# Patient Record
Sex: Male | Born: 1940
Health system: Southern US, Community
[De-identification: ages and names within clinical notes are randomized; demographics above are authoritative.]

## PROBLEM LIST (undated history)

## (undated) DIAGNOSIS — K4090 Unilateral inguinal hernia, without obstruction or gangrene, not specified as recurrent: Secondary | ICD-10-CM

## (undated) DIAGNOSIS — E785 Hyperlipidemia, unspecified: Secondary | ICD-10-CM

## (undated) DIAGNOSIS — M1990 Unspecified osteoarthritis, unspecified site: Secondary | ICD-10-CM

## (undated) HISTORY — DX: Unilateral inguinal hernia, without obstruction or gangrene, not specified as recurrent: K40.90

## (undated) HISTORY — DX: Hyperlipidemia, unspecified: E78.5

## (undated) HISTORY — DX: Unspecified osteoarthritis, unspecified site: M19.90

## (undated) HISTORY — PX: MEDIAL PARTIAL KNEE REPLACEMENT: SHX5965

## (undated) HISTORY — PX: VASECTOMY: SHX75

---

## 2008-06-15 ENCOUNTER — Emergency Department: Payer: Self-pay | Admitting: Emergency Medicine

## 2010-12-29 ENCOUNTER — Ambulatory Visit: Payer: Self-pay | Admitting: Unknown Physician Specialty

## 2011-01-21 ENCOUNTER — Ambulatory Visit (INDEPENDENT_AMBULATORY_CARE_PROVIDER_SITE_OTHER): Payer: Medicare Other | Admitting: Family Medicine

## 2011-01-21 ENCOUNTER — Encounter: Payer: Self-pay | Admitting: Family Medicine

## 2011-01-21 DIAGNOSIS — Z8042 Family history of malignant neoplasm of prostate: Secondary | ICD-10-CM

## 2011-01-21 DIAGNOSIS — M1990 Unspecified osteoarthritis, unspecified site: Secondary | ICD-10-CM

## 2011-01-21 DIAGNOSIS — E7800 Pure hypercholesterolemia, unspecified: Secondary | ICD-10-CM

## 2011-01-21 DIAGNOSIS — E785 Hyperlipidemia, unspecified: Secondary | ICD-10-CM | POA: Insufficient documentation

## 2011-01-21 NOTE — Assessment & Plan Note (Signed)
Flu shot encouraged.  Continue diet and exercise.  Requesting records.  Return for labs.

## 2011-01-21 NOTE — Assessment & Plan Note (Signed)
PSA false positives d/w pt.  Requesting records.  Return for labs.

## 2011-01-21 NOTE — Assessment & Plan Note (Addendum)
Per ortho.  Requesting records.

## 2011-01-21 NOTE — Patient Instructions (Signed)
I would get a flu shot each fall.   I'll check your old records for the pneumonia and shingles shot.  If you haven't had them already, then it would be reasonable to get them in the future.   Come back for fasting labs.  You can get your results through our phone system.  Follow the instructions on the blue card. Glad to see you.  Take care.   I would check your labs again next year before a physical.

## 2011-01-21 NOTE — Progress Notes (Signed)
Elevated Cholesterol: Using medications without problems:no rx meds Muscle aches: no Diet compliance:yes Exercise:yes  FH prostate cancer, no personal hx. Prev with normal PSAs.  Requesting records.  No pelvic pain, no dysuria.  No blood known in urine.   OA, knee, s/p synvisc injections. May end up with replacements. Has affected his golf game. Often puffy after exercise.   PMH and SH reviewed  Meds, vitals, and allergies reviewed.   ROS: See HPI.  Otherwise negative.    GEN: nad, alert and oriented HEENT: mucous membranes moist NECK: supple w/o LA CV: rrr. PULM: ctab, no inc wob ABD: soft, +bs EXT: no edema SKIN: no acute rash Prostate gland firm and smooth, no enlargement, nodularity, tenderness, mass, asymmetry or induration.

## 2011-01-25 ENCOUNTER — Other Ambulatory Visit (INDEPENDENT_AMBULATORY_CARE_PROVIDER_SITE_OTHER): Payer: Medicare Other

## 2011-01-25 DIAGNOSIS — Z8042 Family history of malignant neoplasm of prostate: Secondary | ICD-10-CM

## 2011-01-25 DIAGNOSIS — E7800 Pure hypercholesterolemia, unspecified: Secondary | ICD-10-CM

## 2011-01-25 LAB — LIPID PANEL
Cholesterol: 274 mg/dL — ABNORMAL HIGH (ref 0–200)
Total CHOL/HDL Ratio: 6
VLDL: 50 mg/dL — ABNORMAL HIGH (ref 0.0–40.0)

## 2011-01-25 LAB — COMPREHENSIVE METABOLIC PANEL
ALT: 29 U/L (ref 0–53)
AST: 23 U/L (ref 0–37)
Alkaline Phosphatase: 63 U/L (ref 39–117)
Calcium: 9.9 mg/dL (ref 8.4–10.5)
Chloride: 109 mEq/L (ref 96–112)
Creatinine, Ser: 0.9 mg/dL (ref 0.4–1.5)

## 2011-01-27 ENCOUNTER — Encounter: Payer: Self-pay | Admitting: *Deleted

## 2011-03-22 ENCOUNTER — Ambulatory Visit: Payer: Self-pay | Admitting: Unknown Physician Specialty

## 2011-05-05 ENCOUNTER — Telehealth: Payer: Self-pay | Admitting: Family Medicine

## 2011-05-05 NOTE — Telephone Encounter (Signed)
Triage Record Num: [id number] Operator: Revonda Humphrey Patient Name: Victor Smith Call Date & Time: 05/05/2011 8:33:54AM Patient Phone: PCP: Patient Gender: Male PCP Fax : Patient DOB: Aug 19, 1940 Practice Name: [facility] Reason for Call: Caller: Linda/Spouse; PCP: Crawford Givens Clelia Croft); CB#: [phone]; ; ; Call regarding Blisters On Incision Site, Just Had A. Knee Replacement; Had staples removed on Tues 1/22 and very large blister 1.5 inches in diameter appeared over incision site. Has applied dressing to keep area as clean as possible. Last night after shower noting blister filled with blood. Second smaller blister coming in same area. Then noting large blister filled with blood. Had been on Lovinox thru Sun 1/27 then discontinued. Expresses concern about infection since now drainage on dressing that had not been there. Will call Surgeon to follow up now. Protocol(s) Used: Postoperative Problems Protocol(s) Used: Postoperative Wound Care Recommended Outcome per Protocol: See Provider within 4 hours Reason for Outcome: Questions or concerns about postoperative wound Noticeable localized swelling appearing as a lump near incision. Care Advice: ~ 05/05/2011 9:10:22AM Page 1 of 1 [id number]

## 2011-05-05 NOTE — Telephone Encounter (Signed)
Spoke with patient's wife. She said she talked to the surgeons office and they told her there was no reason for him to be seen. It wasn't uncommon to have some fluid or blood build up after the surgery and that he just needed to let it air out and he should be fine. She said she really wanted him evaluated, but said she would watch it today like they advised. She said she would call back if it looked worse after their recommendations.

## 2011-05-05 NOTE — Telephone Encounter (Signed)
Noted  

## 2012-03-16 ENCOUNTER — Other Ambulatory Visit: Payer: Self-pay | Admitting: Family Medicine

## 2012-03-16 DIAGNOSIS — Z125 Encounter for screening for malignant neoplasm of prostate: Secondary | ICD-10-CM

## 2012-03-16 DIAGNOSIS — E7800 Pure hypercholesterolemia, unspecified: Secondary | ICD-10-CM

## 2012-03-21 ENCOUNTER — Other Ambulatory Visit (INDEPENDENT_AMBULATORY_CARE_PROVIDER_SITE_OTHER): Payer: Medicare Other

## 2012-03-21 DIAGNOSIS — Z125 Encounter for screening for malignant neoplasm of prostate: Secondary | ICD-10-CM

## 2012-03-21 DIAGNOSIS — E7800 Pure hypercholesterolemia, unspecified: Secondary | ICD-10-CM

## 2012-03-21 LAB — LIPID PANEL
Cholesterol: 268 mg/dL — ABNORMAL HIGH (ref 0–200)
HDL: 39.8 mg/dL
Total CHOL/HDL Ratio: 7
Triglycerides: 250 mg/dL — ABNORMAL HIGH (ref 0.0–149.0)
VLDL: 50 mg/dL — ABNORMAL HIGH (ref 0.0–40.0)

## 2012-03-21 LAB — COMPREHENSIVE METABOLIC PANEL WITH GFR
ALT: 23 U/L (ref 0–53)
AST: 23 U/L (ref 0–37)
Albumin: 4.3 g/dL (ref 3.5–5.2)
Alkaline Phosphatase: 65 U/L (ref 39–117)
BUN: 20 mg/dL (ref 6–23)
CO2: 24 meq/L (ref 19–32)
Calcium: 9.9 mg/dL (ref 8.4–10.5)
Chloride: 108 meq/L (ref 96–112)
Creatinine, Ser: 0.9 mg/dL (ref 0.4–1.5)
GFR: 90.07 mL/min
Glucose, Bld: 92 mg/dL (ref 70–99)
Potassium: 4.2 meq/L (ref 3.5–5.1)
Sodium: 138 meq/L (ref 135–145)
Total Bilirubin: 1.1 mg/dL (ref 0.3–1.2)
Total Protein: 6.8 g/dL (ref 6.0–8.3)

## 2012-03-21 LAB — PSA: PSA: 0.07 ng/mL — ABNORMAL LOW (ref 0.10–4.00)

## 2012-03-21 LAB — LDL CHOLESTEROL, DIRECT: Direct LDL: 179.1 mg/dL

## 2012-03-28 ENCOUNTER — Ambulatory Visit (INDEPENDENT_AMBULATORY_CARE_PROVIDER_SITE_OTHER): Payer: Medicare Other | Admitting: Family Medicine

## 2012-03-28 ENCOUNTER — Encounter: Payer: Self-pay | Admitting: Family Medicine

## 2012-03-28 VITALS — BP 142/90 | HR 74 | Temp 97.4°F | Ht >= 80 in | Wt 235.8 lb

## 2012-03-28 DIAGNOSIS — Z0000 Encounter for general adult medical examination without abnormal findings: Secondary | ICD-10-CM

## 2012-03-28 DIAGNOSIS — E785 Hyperlipidemia, unspecified: Secondary | ICD-10-CM

## 2012-03-28 NOTE — Patient Instructions (Addendum)
Take care.  Keep exercising and working on your diet.   I would get a flu shot each fall.   We'll request your old records again.

## 2012-03-28 NOTE — Progress Notes (Signed)
I have personally reviewed the Medicare Annual Wellness questionnaire and have noted 1. The patient's medical and social history 2. Their use of alcohol, tobacco or illicit drugs 3. Their current medications and supplements 4. The patient's functional ability including ADL's, fall risks, home safety risks and hearing or visual             impairment. 5. Diet and physical activities 6. Evidence for depression or mood disorders  The patients weight, height, BMI have been recorded in the chart and visual acuity is per eye clinic.  I have made referrals, counseling and provided education to the patient based review of the above and I have provided the pt with a written personalized care plan for preventive services.  See scanned forms.  Routine anticipatory guidance given to patient.  See health maintenance. Flu encouraged.  Shingles encouraged.  PNA- requesting old records again.  Tetanus- requesting old records again.  Colonoscopy not due per patient.  - requesting old records again. Prostate cancer screening d/w pt.  PSA wnl.  Advance directive d/w pt.  Would want wife designated if incapacitated.  Cognitive function addressed- see scanned forms- and if abnormal then additional documentation follows.   Knee replacement done in 2013.   PMH and SH reviewed  Meds, vitals, and allergies reviewed.   ROS: See HPI.  Otherwise negative.    GEN: nad, alert and oriented HEENT: mucous membranes moist NECK: supple w/o LA CV: rrr. PULM: ctab, no inc wob ABD: soft, +bs EXT: no edema SKIN: no acute rash except for mild erythema at the site of bandaid from recent blood draw on L arm.

## 2012-03-28 NOTE — Assessment & Plan Note (Signed)
See scanned forms. Routine anticipatory guidance given to patient. See health maintenance.  Flu encouraged.  Shingles encouraged.  PNA- requesting old records again.  Tetanus- requesting old records again.  Colonoscopy not due per patient. - requesting old records again.  Prostate cancer screening d/w pt. PSA wnl.  Advance directive d/w pt. Would want wife designated if incapacitated.  Cognitive function addressed- see scanned forms- and if abnormal then additional documentation follows.

## 2012-03-28 NOTE — Assessment & Plan Note (Signed)
D/w pt.  He had been off his diet recently.  He doesn't want to start a statin.  No h/o early CAD in family, no personal h/o CAD or CVA.  Continue diet and exercise.

## 2012-09-26 ENCOUNTER — Ambulatory Visit (INDEPENDENT_AMBULATORY_CARE_PROVIDER_SITE_OTHER): Payer: Medicare Other | Admitting: Family Medicine

## 2012-09-26 ENCOUNTER — Encounter: Payer: Self-pay | Admitting: Family Medicine

## 2012-09-26 VITALS — BP 122/70 | HR 70 | Temp 98.4°F | Wt 237.2 lb

## 2012-09-26 DIAGNOSIS — R5381 Other malaise: Secondary | ICD-10-CM

## 2012-09-26 NOTE — Progress Notes (Signed)
Fatigued.  "I've been healthy and my wife is panicking.  I don't think anything is wrong."  Sleeping 9-10 hours at night.  Over the last 3 weeks, episodically noted to have some fatigue, less energy episodically.   No focal changes, ie polyuria, fever, bleeding etc.  He had gone to the [place] and that had disrupted his schedule.  He usually is on a schedule of alternating days of activity and rest.  He had also played golf on some days that were really hot. He didn't know if these were causing the fatigue.  He did have a tick bite but the tick wasn't engorged.  It was removed.  No rash.    Sister in law and his brother had renal disease.  Discussed about routine hydration.  Prev Cr wnl.   Meds, vitals, and allergies reviewed.   ROS: See HPI.  Otherwise, noncontributory.  GEN: nad, alert and oriented HEENT: mucous membranes moist NECK: supple w/o LA CV: rrr PULM: ctab, no inc wob ABD: soft, +bs EXT: no edema SKIN: no acute rash Resolving tick bite site on the R shin.  Minimal pink discoloration w/o FB noted.

## 2012-09-26 NOTE — Patient Instructions (Addendum)
Get some rest, drink enough water to keep your urine clear and if the fatigue continues let me know.  Take care.

## 2012-09-27 DIAGNOSIS — R5381 Other malaise: Secondary | ICD-10-CM | POA: Insufficient documentation

## 2012-09-27 NOTE — Assessment & Plan Note (Signed)
Likely from busy schedule.  He'll rest some and if not resolved we can w/u.  He agrees.  Fu prn. The tick bite appears incidental and inconsequential.

## 2012-11-01 ENCOUNTER — Telehealth: Payer: Self-pay | Admitting: Family Medicine

## 2012-11-01 MED ORDER — AMOXICILLIN 500 MG PO CAPS: 2000.0000 mg | ORAL_CAPSULE | Freq: Once | ORAL | Status: DC

## 2012-11-01 NOTE — Telephone Encounter (Signed)
Pt left vm stating that his periodontist told him that his PCP needs to prescribe 2mg  Penicillin to him before he can have dental procedure r/t "metal in his knee".  Pt wants RX sent to [facility].

## 2012-11-01 NOTE — Telephone Encounter (Signed)
It's usually 2000mg  amoxil before the procedure.  rx sent.  Notify pt.  Thanks.

## 2012-11-02 NOTE — Telephone Encounter (Signed)
Patient advised.

## 2013-02-14 ENCOUNTER — Ambulatory Visit (INDEPENDENT_AMBULATORY_CARE_PROVIDER_SITE_OTHER): Payer: Medicare Other | Admitting: Podiatry

## 2013-02-14 ENCOUNTER — Encounter: Payer: Self-pay | Admitting: Podiatry

## 2013-02-14 VITALS — BP 114/74 | HR 73 | Resp 16 | Ht >= 80 in | Wt 225.0 lb

## 2013-02-14 DIAGNOSIS — M79609 Pain in unspecified limb: Secondary | ICD-10-CM

## 2013-02-14 DIAGNOSIS — B351 Tinea unguium: Secondary | ICD-10-CM

## 2013-02-14 NOTE — Progress Notes (Signed)
Fredia Sorrow presents today for routine nail debridement  Objective: Vital signs are stable he is alert and oriented x3.  Assessment: Nails are thick yellow dystrophic with mycotic and painful palpation pain in limb secondary to onychomycosis.  Plan: Debridement of nails in thickness and length as cover service.

## 2013-03-26 ENCOUNTER — Other Ambulatory Visit: Payer: Self-pay | Admitting: Family Medicine

## 2013-03-26 ENCOUNTER — Other Ambulatory Visit: Payer: BC Managed Care – PPO

## 2013-03-26 ENCOUNTER — Other Ambulatory Visit (INDEPENDENT_AMBULATORY_CARE_PROVIDER_SITE_OTHER): Payer: Medicare Other

## 2013-03-26 DIAGNOSIS — Z8042 Family history of malignant neoplasm of prostate: Secondary | ICD-10-CM

## 2013-03-26 DIAGNOSIS — Z125 Encounter for screening for malignant neoplasm of prostate: Secondary | ICD-10-CM

## 2013-03-26 DIAGNOSIS — E785 Hyperlipidemia, unspecified: Secondary | ICD-10-CM

## 2013-03-26 LAB — COMPREHENSIVE METABOLIC PANEL
ALT: 34 U/L (ref 0–53)
BUN: 12 mg/dL (ref 6–23)
CO2: 24 mEq/L (ref 19–32)
Calcium: 9.9 mg/dL (ref 8.4–10.5)
Chloride: 107 mEq/L (ref 96–112)
Creatinine, Ser: 1.1 mg/dL (ref 0.4–1.5)
GFR: 72.46 mL/min (ref >60.00)
Glucose, Bld: 103 mg/dL — ABNORMAL HIGH (ref 70–99)

## 2013-03-26 LAB — LIPID PANEL: HDL: 44.1 mg/dL (ref >39.00)

## 2013-03-26 LAB — LDL CHOLESTEROL, DIRECT: Direct LDL: 199.9 mg/dL

## 2013-03-27 ENCOUNTER — Encounter: Payer: Self-pay | Admitting: *Deleted

## 2013-03-30 ENCOUNTER — Encounter: Payer: BC Managed Care – PPO | Admitting: Family Medicine

## 2013-04-11 ENCOUNTER — Encounter: Payer: Self-pay | Admitting: Family Medicine

## 2013-04-11 ENCOUNTER — Ambulatory Visit (INDEPENDENT_AMBULATORY_CARE_PROVIDER_SITE_OTHER): Payer: Medicare Other | Admitting: Family Medicine

## 2013-04-11 VITALS — BP 130/80 | HR 79 | Temp 98.5°F | Ht >= 80 in | Wt 231.2 lb

## 2013-04-11 DIAGNOSIS — N644 Mastodynia: Secondary | ICD-10-CM

## 2013-04-11 DIAGNOSIS — N6310 Unspecified lump in the right breast, unspecified quadrant: Secondary | ICD-10-CM

## 2013-04-11 DIAGNOSIS — N63 Unspecified lump in unspecified breast: Secondary | ICD-10-CM

## 2013-04-11 NOTE — Progress Notes (Signed)
Pre-visit discussion using our clinic review tool. No additional management support is needed unless otherwise documented below in the visit note.  
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
## 2013-04-11 NOTE — Patient Instructions (Signed)
REFERRAL: GO THE THE FRONT ROOM AT THE ENTRANCE OF OUR CLINIC, NEAR CHECK IN. ASK FOR Victor Smith. SHE WILL HELP YOU SET UP YOUR REFERRAL. DATE: TIME:  

## 2013-04-12 ENCOUNTER — Other Ambulatory Visit: Payer: Self-pay

## 2013-04-16 ENCOUNTER — Encounter: Payer: Self-pay | Admitting: Family Medicine

## 2013-04-16 ENCOUNTER — Ambulatory Visit: Payer: Self-pay | Admitting: Family Medicine

## 2013-04-19 ENCOUNTER — Encounter: Payer: Self-pay | Admitting: Family Medicine

## 2013-05-14 ENCOUNTER — Other Ambulatory Visit: Payer: Self-pay

## 2013-05-16 ENCOUNTER — Ambulatory Visit: Payer: Medicare Other | Admitting: Podiatry

## 2013-05-21 ENCOUNTER — Encounter: Payer: Self-pay | Admitting: Family Medicine

## 2013-05-21 ENCOUNTER — Ambulatory Visit (INDEPENDENT_AMBULATORY_CARE_PROVIDER_SITE_OTHER): Payer: Medicare Other | Admitting: Family Medicine

## 2013-05-21 VITALS — BP 132/76 | HR 70 | Temp 97.5°F | Ht >= 80 in | Wt 227.8 lb

## 2013-05-21 DIAGNOSIS — Z1211 Encounter for screening for malignant neoplasm of colon: Secondary | ICD-10-CM

## 2013-05-21 DIAGNOSIS — Z0000 Encounter for general adult medical examination without abnormal findings: Secondary | ICD-10-CM

## 2013-05-21 DIAGNOSIS — E785 Hyperlipidemia, unspecified: Secondary | ICD-10-CM

## 2013-05-21 DIAGNOSIS — Z8042 Family history of malignant neoplasm of prostate: Secondary | ICD-10-CM

## 2013-05-21 DIAGNOSIS — N62 Hypertrophy of breast: Secondary | ICD-10-CM

## 2013-05-21 NOTE — Patient Instructions (Addendum)
Check with your insurance to see if they will cover the shingles and tetanus shots. I would get a flu shot each fall.  It would be reasonable to get a pneumonia shot.   Rosaria Ferries will call about your referral for the colonoscopy.  Take care. Glad to see you.   Gradually return to exercise as groin pain allows.

## 2013-05-21 NOTE — Progress Notes (Signed)
Pre-visit discussion using our clinic review tool. No additional management support is needed unless otherwise documented below in the visit note.  I have personally reviewed the Medicare Annual Wellness questionnaire and have noted 1. The patient's medical and social history 2. Their use of alcohol, tobacco or illicit drugs 3. Their current medications and supplements 4. The patient's functional ability including ADL's, fall risks, home safety risks and hearing or visual             impairment. 5. Diet and physical activities 6. Evidence for depression or mood disorders  The patients weight, height, BMI have been recorded in the chart and visual acuity is per eye clinic.  I have made referrals, counseling and provided education to the patient based review of the above and I have provided the pt with a written personalized care plan for preventive services.  See scanned forms.  Routine anticipatory guidance given to patient.  See health maintenance. Flu encouraged.  Shingles encouraged PNA encouraged Tetanus encouraged Colonoscopy d/w pt.  Referral ordered.  Prostate cancer screening- PSA wnl.  Advance directive- wife designated if incapacitated.  Cognitive function addressed- see scanned forms- and if abnormal then additional documentation follows.   Likely with a recent R groin pull.  Some better after icing.  Discussed very gradual return to exercise as pain allows. No bulge felt by patient.  Pain is at the origin of the adductors in the R groin.   HLD d/w pt.  He would like to avoid extra meds and this is reasonable. He doesn't have HTN or CAD/CVA hx.   PMH and SH reviewed  Meds, vitals, and allergies reviewed.   ROS: See HPI.  Otherwise negative.    GEN: nad, alert and oriented HEENT: mucous membranes moist NECK: supple w/o LA CV: rrr. PULM: ctab, no inc wob ABD: soft, +bs EXT: no edema SKIN: no acute rash

## 2013-05-22 DIAGNOSIS — N62 Hypertrophy of breast: Secondary | ICD-10-CM | POA: Insufficient documentation

## 2013-05-22 MED ORDER — ZOSTER VACCINE LIVE 19400 UNT/0.65ML ~~LOC~~ SOLR: 0.6500 mL | Freq: Once | SUBCUTANEOUS | Status: DC

## 2013-05-22 NOTE — Assessment & Plan Note (Addendum)
See scanned forms.  Routine anticipatory guidance given to patient.  See health maintenance. Flu encouraged.  Shingles encouraged PNA encouraged Tetanus encouraged Colonoscopy d/w pt.  Referral ordered.  Prostate cancer screening- PSA wnl.  Advance directive- wife designated if incapacitated.  Cognitive function addressed- see scanned forms- and if abnormal then additional documentation follows.  Discussed gradual return to exercise.

## 2013-05-22 NOTE — Assessment & Plan Note (Signed)
HLD d/w pt. He would like to avoid extra meds and this is reasonable. He doesn't have HTN or CAD/CVA hx.

## 2013-05-22 NOTE — Assessment & Plan Note (Signed)
PSA wnl

## 2013-05-22 NOTE — Assessment & Plan Note (Signed)
Noted on imaging prev, nonbothersome for patient.  Not as prominent now per patient.

## 2013-06-13 ENCOUNTER — Ambulatory Visit (INDEPENDENT_AMBULATORY_CARE_PROVIDER_SITE_OTHER): Payer: Medicare Other | Admitting: Podiatry

## 2013-06-13 ENCOUNTER — Encounter: Payer: Self-pay | Admitting: Podiatry

## 2013-06-13 VITALS — BP 144/59 | HR 72 | Resp 16

## 2013-06-13 DIAGNOSIS — B351 Tinea unguium: Secondary | ICD-10-CM

## 2013-06-13 DIAGNOSIS — M79609 Pain in unspecified limb: Secondary | ICD-10-CM

## 2013-06-13 NOTE — Progress Notes (Signed)
He presents today with a chief complaint of painful E. elongated toenails.  Objective: Pulses are strongly palpable bilateral nails are thick yellow dystrophic onychomycotic and painful palpation.  Assessment: Pain in limb secondary to onychomycosis 1 through 5 bilateral.  Plan: Debridement of nails 1 through 5 bilateral covered service secondary to pain followup with him in 3 months.

## 2013-07-16 ENCOUNTER — Ambulatory Visit: Payer: Self-pay | Admitting: Gastroenterology

## 2013-07-16 LAB — HM COLONOSCOPY

## 2013-07-19 LAB — PATHOLOGY REPORT

## 2013-07-27 ENCOUNTER — Encounter: Payer: Self-pay | Admitting: Family Medicine

## 2013-08-15 ENCOUNTER — Encounter: Payer: Self-pay | Admitting: Family Medicine

## 2013-08-31 ENCOUNTER — Encounter: Payer: Self-pay | Admitting: Family Medicine

## 2013-09-11 ENCOUNTER — Encounter: Payer: Self-pay | Admitting: Family Medicine

## 2013-09-12 ENCOUNTER — Ambulatory Visit: Payer: Medicare Other | Admitting: Podiatry

## 2013-10-04 ENCOUNTER — Encounter: Payer: Self-pay | Admitting: Podiatry

## 2013-10-04 ENCOUNTER — Ambulatory Visit (INDEPENDENT_AMBULATORY_CARE_PROVIDER_SITE_OTHER): Payer: Medicare Other | Admitting: Podiatry

## 2013-10-04 DIAGNOSIS — B351 Tinea unguium: Secondary | ICD-10-CM

## 2013-10-04 DIAGNOSIS — M79609 Pain in unspecified limb: Secondary | ICD-10-CM

## 2013-10-04 DIAGNOSIS — M79676 Pain in unspecified toe(s): Secondary | ICD-10-CM

## 2013-10-04 NOTE — Progress Notes (Signed)
He presents today chief complaint of painful elongated toenails.  Objective: Nails are thick yellow dystrophic onychomycotic and painful palpation. Pulses are palpable bilateral.  Assessment: Pain in limb secondary to onychomycosis 1 through 5 bilateral.  Plan: Debridement of nails 1 through 5 bilateral. 

## 2014-02-04 ENCOUNTER — Ambulatory Visit (INDEPENDENT_AMBULATORY_CARE_PROVIDER_SITE_OTHER): Payer: Medicare Other | Admitting: Podiatry

## 2014-02-04 DIAGNOSIS — M79676 Pain in unspecified toe(s): Secondary | ICD-10-CM

## 2014-02-04 DIAGNOSIS — B351 Tinea unguium: Secondary | ICD-10-CM

## 2014-02-04 NOTE — Progress Notes (Signed)
He presents today with a chief complaint of painful elongated toenails 1 through 5 bilateral. ° °Objective: Pulses are strongly palpable bilateral. Nails are thick yellow dystrophic lytic mycotic and painful palpation. ° °Assessment: Pain in limb secondary to onychomycosis 1 through 5 bilateral. ° °Plan: Debridement of nails 1 through 5 bilateral covered service secondary to pain. °

## 2014-04-15 ENCOUNTER — Ambulatory Visit: Payer: Medicare Other | Admitting: Podiatry

## 2014-04-15 ENCOUNTER — Ambulatory Visit (INDEPENDENT_AMBULATORY_CARE_PROVIDER_SITE_OTHER): Payer: Medicare Other | Admitting: Podiatry

## 2014-04-15 DIAGNOSIS — M79676 Pain in unspecified toe(s): Secondary | ICD-10-CM

## 2014-04-15 DIAGNOSIS — B351 Tinea unguium: Secondary | ICD-10-CM

## 2014-04-15 NOTE — Progress Notes (Signed)
He presents today with a chief complaint of painful elongated toenails 1 through 5 bilateral.  Objective: Pulses are strongly palpable bilateral. Nails are thick yellow dystrophic lytic mycotic and painful palpation.  Assessment: Pain in limb secondary to onychomycosis 1 through 5 bilateral.  Plan: Debridement of nails 1 through 5 bilateral covered service secondary to pain.

## 2014-05-19 ENCOUNTER — Other Ambulatory Visit: Payer: Self-pay | Admitting: Family Medicine

## 2014-05-19 DIAGNOSIS — Z125 Encounter for screening for malignant neoplasm of prostate: Secondary | ICD-10-CM

## 2014-05-19 DIAGNOSIS — E785 Hyperlipidemia, unspecified: Secondary | ICD-10-CM

## 2014-05-23 ENCOUNTER — Other Ambulatory Visit (INDEPENDENT_AMBULATORY_CARE_PROVIDER_SITE_OTHER): Payer: 59

## 2014-05-23 DIAGNOSIS — Z125 Encounter for screening for malignant neoplasm of prostate: Secondary | ICD-10-CM

## 2014-05-23 DIAGNOSIS — E785 Hyperlipidemia, unspecified: Secondary | ICD-10-CM

## 2014-05-23 LAB — PSA, MEDICARE: PSA: 0.05 ng/mL — AB (ref 0.10–4.00)

## 2014-05-23 LAB — COMPREHENSIVE METABOLIC PANEL
ALT: 41 U/L (ref 0–53)
AST: 35 U/L (ref 0–37)
Albumin: 3.9 g/dL (ref 3.5–5.2)
Alkaline Phosphatase: 73 U/L (ref 39–117)
BUN: 25 mg/dL — ABNORMAL HIGH (ref 6–23)
CO2: 23 meq/L (ref 19–32)
Calcium: 9.8 mg/dL (ref 8.4–10.5)
Chloride: 110 mEq/L (ref 96–112)
Creatinine, Ser: 0.97 mg/dL (ref 0.40–1.50)
GFR: 80.02 mL/min (ref >60.00)
Glucose, Bld: 93 mg/dL (ref 70–99)
Potassium: 4.3 mEq/L (ref 3.5–5.1)
SODIUM: 139 meq/L (ref 135–145)
TOTAL PROTEIN: 6.4 g/dL (ref 6.0–8.3)
Total Bilirubin: 0.6 mg/dL (ref 0.2–1.2)

## 2014-05-23 LAB — LIPID PANEL
CHOLESTEROL: 286 mg/dL — AB (ref 0–200)
HDL: 44.3 mg/dL (ref >39.00)
NonHDL: 241.7
Total CHOL/HDL Ratio: 6
Triglycerides: 212 mg/dL — ABNORMAL HIGH (ref 0.0–149.0)
VLDL: 42.4 mg/dL — AB (ref 0.0–40.0)

## 2014-05-23 LAB — LDL CHOLESTEROL, DIRECT: Direct LDL: 179 mg/dL

## 2014-05-31 ENCOUNTER — Encounter: Payer: Self-pay | Admitting: Family Medicine

## 2014-05-31 ENCOUNTER — Ambulatory Visit (INDEPENDENT_AMBULATORY_CARE_PROVIDER_SITE_OTHER): Payer: Medicare Other | Admitting: Family Medicine

## 2014-05-31 VITALS — BP 130/68 | HR 70 | Temp 97.5°F | Ht >= 80 in | Wt 231.0 lb

## 2014-05-31 DIAGNOSIS — Z0000 Encounter for general adult medical examination without abnormal findings: Secondary | ICD-10-CM

## 2014-05-31 DIAGNOSIS — E785 Hyperlipidemia, unspecified: Secondary | ICD-10-CM

## 2014-05-31 NOTE — Progress Notes (Signed)
Pre visit review using our clinic review tool, if applicable. No additional management support is needed unless otherwise documented below in the visit note.  I have personally reviewed the Medicare Annual Wellness questionnaire data and have noted 1. The patient's medical and social history 2. Their use of alcohol, tobacco or illicit drugs 3. Their current medications and supplements 4. The patient's functional ability including ADL's, fall risks, home safety risks and hearing or visual             impairment. 5. Diet and physical activities 6. Evidence for depression or mood disorders  The patients weight, height, BMI have been recorded in the chart and visual acuity is per eye clinic.  I have made referrals, counseling and provided education to the patient based review of the above and I have provided the pt with a written personalized care plan for preventive services.  Provider list updated.  Routine anticipatory guidance given to patient.  See health maintenance.  Flu encouraged Shingles encouraged PNA encouraged Tetanus encouraged Colonoscopy 2015 Prostate cancer screening- PSA wnl.  Advance directive- wife designated if incapacitated.  Diet and exercise- d/w pt with both.  Doing well on both.   Cognitive function addressed- see scanned forms- and if abnormal then additional documentation follows.  No falls, no hospitalizations, no surgeries.  A&O x3, 3/3 recall, able to do math, read a watch.   HLD d/w pt. He would like to avoid extra meds. He doesn't have HTN or CAD/CVA hx.  D/w pt about options.    PMH and SH reviewed  Meds, vitals, and allergies reviewed.   ROS: See HPI.  Otherwise negative.    GEN: nad, alert and oriented HEENT: mucous membranes moist NECK: supple w/o LA CV: rrr. PULM: ctab, no inc wob ABD: soft, +bs EXT: no edema SKIN: no acute rash

## 2014-05-31 NOTE — Patient Instructions (Addendum)
Check with your insurance to see if they will cover the shingles shot. I would still think about getting a flu, tetanus, pneumonia shot.   Take care.  Keep exercising.  Glad to see you.

## 2014-06-03 NOTE — Assessment & Plan Note (Signed)
Flu encouraged Shingles encouraged PNA encouraged Tetanus encouraged Colonoscopy 2015 Prostate cancer screening- PSA wnl.  Advance directive- wife designated if incapacitated.  Diet and exercise- d/w pt with both.  Doing well on both.   Cognitive function addressed- see scanned forms- and if abnormal then additional documentation follows.

## 2014-06-03 NOTE — Assessment & Plan Note (Signed)
HLD d/w pt. He would like to avoid extra meds. He doesn't have HTN or CAD/CVA hx.  D/w pt about options.  He doesn't want to start a statin.  Continue work on diet and exercise.

## 2014-06-04 ENCOUNTER — Encounter: Payer: Self-pay | Admitting: Family Medicine

## 2014-06-04 DIAGNOSIS — IMO0001 Reserved for inherently not codable concepts without codable children: Secondary | ICD-10-CM | POA: Insufficient documentation

## 2014-06-04 DIAGNOSIS — Z87898 Personal history of other specified conditions: Secondary | ICD-10-CM | POA: Insufficient documentation

## 2014-07-15 ENCOUNTER — Ambulatory Visit: Payer: Self-pay

## 2014-07-24 ENCOUNTER — Ambulatory Visit (INDEPENDENT_AMBULATORY_CARE_PROVIDER_SITE_OTHER): Payer: Medicare Other | Admitting: Family Medicine

## 2014-07-24 ENCOUNTER — Encounter: Payer: Self-pay | Admitting: Family Medicine

## 2014-07-24 VITALS — BP 122/74 | HR 57 | Temp 97.7°F | Wt 236.2 lb

## 2014-07-24 DIAGNOSIS — R05 Cough: Secondary | ICD-10-CM | POA: Diagnosis not present

## 2014-07-24 DIAGNOSIS — R059 Cough, unspecified: Secondary | ICD-10-CM | POA: Insufficient documentation

## 2014-07-24 NOTE — Patient Instructions (Signed)
Nice to meet you. Please start taking Claritin or Allegra over the counter as directed. Ok to stop taking once you feel better. If your symptoms progress, please call us.

## 2014-07-24 NOTE — Progress Notes (Signed)
Pre visit review using our clinic review tool, if applicable. No additional management support is needed unless otherwise documented below in the visit note. 

## 2014-07-24 NOTE — Assessment & Plan Note (Signed)
New- viral vs allergic rhinitis. >25 minutes spent in face to face time with patient, >50% spent in counselling or coordination of care No signs on exam of infection- abx not warranted at this time.  I do feel he would benefit from antihistamine.  He had a lot of questions about this. Call or return to clinic prn if these symptoms worsen or fail to improve as anticipated. The patient indicates understanding of these issues and agrees with the plan.

## 2014-07-24 NOTE — Progress Notes (Signed)
Subjective:   Patient ID: Victor Smith, male    DOB: [birth year], 74 y.o.   MRN: [id number]  Victor Smith is a pleasant 74 y.o. year old male pt of Dr. Damita Dunnings, new to me, who presents to clinic today with Cough and Nasal Congestion  on 07/24/2014  HPI:  Over 1 week of cough, congestion.  Runny nose is constant- "like a faucette."  Did cough up greenish sputum yesterday but most of what he coughs up is clear. No sinus pressure. No fever.  No CP or SOB. No abdominal pain.  Goes to a chiropractor and states that after his adjustments sometimes his nose runs but never for this long. Has been around his grandchildren who were sick.  Says he does not believe in rxs so has been taking vitamins.  Current Outpatient Prescriptions on File Prior to Visit  Medication Sig Dispense Refill  . Ascorbic Acid (VITAMIN C) 1000 MG tablet Takes 4,000 mg. Daily.     Marland Kitchen aspirin 81 MG tablet Take 81 mg by mouth every other day.     . fish oil-omega-3 fatty acids 1000 MG capsule 4000 mg daily    . Multiple Vitamin (MULTIVITAMIN) tablet Take 1 tablet by mouth daily.      . niacin (NIASPAN) 1000 MG CR tablet Take 2,000 mg daily.      No current facility-administered medications on file prior to visit.    Allergies  Allergen Reactions  . Latex     And coban & bandaids, causes a rash    Past Medical History  Diagnosis Date  . Hyperlipidemia   . Arthritis     knee, per Dr. Jefm Bryant    Past Surgical History  Procedure Laterality Date  . Vasectomy    . Medial partial knee replacement      left 2013    Family History  Problem Relation Age of Onset  . Diabetes Mother     in 4s  . Hypertension Mother     in 22s  . Prostate cancer Father   . Alcohol abuse Maternal Grandmother   . Colon cancer Neg Hx   . Cancer Brother     renal cancer    History   Social History  . Marital Status: Married    Spouse Name: N/A  . Number of Children: N/A  . Years of Education: N/A   Occupational  History  . Not on file.   Social History Main Topics  . Smoking status: Never Smoker   . Smokeless tobacco: Never Used  . Alcohol Use: No  . Drug Use: No  . Sexual Activity: Not on file   Other Topics Concern  . 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   Review of Systems  Constitutional: Negative.   HENT: Positive for congestion, rhinorrhea, sinus pressure and sneezing. Negative for ear pain, facial swelling, nosebleeds, tinnitus, trouble swallowing and voice change.   Respiratory: Positive for cough. Negative for shortness of breath, wheezing and stridor.   Cardiovascular: Negative.   Endocrine: Negative.   Genitourinary: Negative.   Musculoskeletal: Negative.   Skin: Negative.   Neurological: Negative.   Hematological: Negative.   Psychiatric/Behavioral: Negative.   All other systems reviewed  and are negative.      Objective:    BP 122/74 mmHg  Pulse 57  Temp(Src) 97.7 F (36.5 C) (Oral)  Wt 236 lb 4 oz (107.162 kg)  SpO2 98%   Physical Exam  Constitutional: He is oriented to person, place, and time. He appears well-developed and well-nourished. No distress.  HENT:  Head: Normocephalic.  Right Ear: Hearing and tympanic membrane normal.  Left Ear: Hearing and tympanic membrane normal.  Nose: Rhinorrhea present. Right sinus exhibits no maxillary sinus tenderness and no frontal sinus tenderness. Left sinus exhibits no maxillary sinus tenderness and no frontal sinus tenderness.  Mouth/Throat: No oropharyngeal exudate or posterior oropharyngeal edema.  +PND  Cardiovascular: Normal rate and regular rhythm.   Pulmonary/Chest: Effort normal and breath sounds normal. No respiratory distress. He has no wheezes. He has no rales. He  exhibits no tenderness.  Starts coughing when he talks  Abdominal: Soft. Bowel sounds are normal.  Neurological: He is alert and oriented to person, place, and time. No cranial nerve deficit.  Skin: Skin is warm and dry.  Psychiatric: He has a normal mood and affect. His behavior is normal. Thought content normal.  Nursing note and vitals reviewed.         Assessment & Plan:   Cough No Follow-up on file.

## 2014-07-25 ENCOUNTER — Ambulatory Visit (INDEPENDENT_AMBULATORY_CARE_PROVIDER_SITE_OTHER): Payer: Medicare Other | Admitting: Podiatry

## 2014-07-25 ENCOUNTER — Encounter: Payer: Self-pay | Admitting: Podiatry

## 2014-07-25 DIAGNOSIS — M79676 Pain in unspecified toe(s): Secondary | ICD-10-CM | POA: Diagnosis not present

## 2014-07-25 DIAGNOSIS — B351 Tinea unguium: Secondary | ICD-10-CM | POA: Diagnosis not present

## 2014-07-30 NOTE — Progress Notes (Signed)
Patient ID: KANISHK STROEBEL, male   DOB: 1940/08/11, 74 y.o.   MRN: [id number]  Subjective: 74 y.o.-year-old male returns the office today for painful, elongated, thickened toenails which he is unable to trim himself. Denies any redness or drainage around the nails. Denies any acute changes since last appointment and no new complaints today. Denies any systemic complaints such as fevers, chills, nausea, vomiting.   Objective: AAO 3, NAD DP/PT pulses palpable, CRT less than 3 seconds Protective sensation intact with Simms Weinstein monofilament, Achilles tendon reflex intact.  Nails hypertrophic, dystrophic, elongated, brittle, discolored 10. There is tenderness overlying the nails 1-5 bilatearlly. There is no surrounding erythema or drainage along the nail sites. No open lesions or pre-ulcerative lesions are identified. No other areas of tenderness bilateral lower extremities. No overlying edema, erythema, increased warmth. No pain with calf compression, swelling, warmth, erythema.  Assessment: Patient presents with symptomatic onychomycosis  Plan: -Treatment options including alternatives, risks, complications were discussed -Nails sharply debrided 10 without complication/bleeding. -Discussed daily foot inspection. If there are any changes, to call the office immediately.  -Follow-up in 3 months or sooner if any problems are to arise. In the meantime, encouraged to call the office with any questions, concerns, changes symptoms.

## 2014-10-15 ENCOUNTER — Ambulatory Visit (INDEPENDENT_AMBULATORY_CARE_PROVIDER_SITE_OTHER): Payer: Medicare Other | Admitting: Podiatry

## 2014-10-15 DIAGNOSIS — B351 Tinea unguium: Secondary | ICD-10-CM

## 2014-10-15 DIAGNOSIS — M79676 Pain in unspecified toe(s): Secondary | ICD-10-CM | POA: Diagnosis not present

## 2014-10-15 NOTE — Progress Notes (Signed)
Patient ID: Victor Smith, male   DOB: 10/20/1940, 74 y.o.   MRN: [id number]  Subjective: 74 y.o.-year-old male returns the office today for painful, elongated, thickened toenails which he is unable to trim himself. Denies any redness or drainage around the nails. Denies any acute changes since last appointment and no new complaints today. Denies any systemic complaints such as fevers, chills, nausea, vomiting.   Objective: AAO 3, NAD DP/PT pulses palpable, CRT less than 3 seconds Nails hypertrophic, dystrophic, elongated, brittle, discolored 10. There is tenderness overlying the nails 1-5 bilatearlly. There is no surrounding erythema or drainage along the nail sites. No open lesions or pre-ulcerative lesions are identified. No other areas of tenderness bilateral lower extremities. No overlying edema, erythema, increased warmth. No pain with calf compression, swelling, warmth, erythema.  Assessment: Patient presents with symptomatic onychomycosis  Plan: -Treatment options including alternatives, risks, complications were discussed -Nails sharply debrided 10 without complication/bleeding. -Discussed daily foot inspection. If there are any changes, to call the office immediately.  -Follow-up in 3 months or sooner if any problems are to arise. In the meantime, encouraged to call the office with any questions, concerns, changes symptoms.  Celesta Gentile, DPM

## 2015-01-07 ENCOUNTER — Ambulatory Visit: Payer: Medicare Other

## 2015-01-09 ENCOUNTER — Ambulatory Visit (INDEPENDENT_AMBULATORY_CARE_PROVIDER_SITE_OTHER): Payer: Medicare Other | Admitting: Podiatry

## 2015-01-09 DIAGNOSIS — M79676 Pain in unspecified toe(s): Secondary | ICD-10-CM | POA: Diagnosis not present

## 2015-01-09 DIAGNOSIS — B351 Tinea unguium: Secondary | ICD-10-CM

## 2015-01-09 NOTE — Progress Notes (Signed)
Patient ID: Victor Smith, male   DOB: 05-Mar-1941, 74 y.o.   MRN: [id number]  Subjective: 74 y.o.-year-old male returns the office today for painful, elongated, thickened toenails which he is unable to trim himself. Denies any redness or drainage around the nails. Denies any acute changes since last appointment and no new complaints today. Denies any systemic complaints such as fevers, chills, nausea, vomiting.   Objective: AAO 3, NAD DP/PT pulses palpable, CRT less than 3 seconds Nails hypertrophic, dystrophic, elongated, brittle, discolored 10. There is tenderness overlying the nails 1-5 bilatearlly. There is no surrounding erythema or drainage along the nail sites. No open lesions or pre-ulcerative lesions are identified. No other areas of tenderness bilateral lower extremities. No overlying edema, erythema, increased warmth. No pain with calf compression, swelling, warmth, erythema.  Assessment: Patient presents with symptomatic onychomycosis  Plan: -Treatment options including alternatives, risks, complications were discussed -Nails sharply debrided 10 without complication/bleeding. -Discussed daily foot inspection. If there are any changes, to call the office immediately.  -Follow-up in 10 weeks or sooner if any problems are to arise. In the meantime, encouraged to call the office with any questions, concerns, changes symptoms.   Celesta Gentile, DPM

## 2015-03-20 ENCOUNTER — Ambulatory Visit (INDEPENDENT_AMBULATORY_CARE_PROVIDER_SITE_OTHER): Payer: Medicare Other | Admitting: Podiatry

## 2015-03-20 ENCOUNTER — Encounter: Payer: Self-pay | Admitting: Podiatry

## 2015-03-20 DIAGNOSIS — M79676 Pain in unspecified toe(s): Secondary | ICD-10-CM | POA: Diagnosis not present

## 2015-03-20 DIAGNOSIS — B351 Tinea unguium: Secondary | ICD-10-CM

## 2015-03-22 NOTE — Progress Notes (Signed)
Patient ID: Benjy D Monrreal, male   DOB: 01/14/1941, 74 y.o.   MRN: [id number] ° °Subjective: °74 y.o.-year-old male returns the office today for painful, elongated, thickened toenails which he is unable to trim himself. Denies any redness or drainage around the nails. Denies any acute changes since last appointment and no new complaints today. Denies any systemic complaints such as fevers, chills, nausea, vomiting.  ° °Objective: °AAO ×3, NAD °DP/PT pulses palpable, CRT less than 3 seconds °Nails hypertrophic, dystrophic, elongated, brittle, discolored ×10. There is tenderness overlying the nails 1-5 bilatearlly. There is no surrounding erythema or drainage along the nail sites. °No open lesions or pre-ulcerative lesions are identified. °No other areas of tenderness bilateral lower extremities. No overlying edema, erythema, increased warmth. °No pain with calf compression, swelling, warmth, erythema. ° °Assessment: °Patient presents with symptomatic onychomycosis ° °Plan: °-Treatment options including alternatives, risks, complications were discussed °-Nails sharply debrided ×10 without complication/bleeding. °-Discussed daily foot inspection. If there are any changes, to call the office immediately.  °-Follow-up in 10 weeks or sooner if any problems are to arise. In the meantime, encouraged to call the office with any questions, concerns, changes symptoms.  ° °Neely Kammerer, DPM °

## 2015-05-27 ENCOUNTER — Other Ambulatory Visit: Payer: Self-pay | Admitting: Family Medicine

## 2015-05-27 DIAGNOSIS — Z125 Encounter for screening for malignant neoplasm of prostate: Secondary | ICD-10-CM

## 2015-05-27 DIAGNOSIS — E785 Hyperlipidemia, unspecified: Secondary | ICD-10-CM

## 2015-05-30 ENCOUNTER — Other Ambulatory Visit: Payer: Medicare Other

## 2015-05-30 ENCOUNTER — Other Ambulatory Visit (INDEPENDENT_AMBULATORY_CARE_PROVIDER_SITE_OTHER): Payer: Medicare Other

## 2015-05-30 DIAGNOSIS — E785 Hyperlipidemia, unspecified: Secondary | ICD-10-CM | POA: Diagnosis not present

## 2015-05-30 DIAGNOSIS — Z125 Encounter for screening for malignant neoplasm of prostate: Secondary | ICD-10-CM | POA: Diagnosis not present

## 2015-05-30 LAB — COMPREHENSIVE METABOLIC PANEL
ALK PHOS: 74 U/L (ref 39–117)
ALT: 27 U/L (ref 0–53)
AST: 18 U/L (ref 0–37)
Albumin: 4.2 g/dL (ref 3.5–5.2)
BILIRUBIN TOTAL: 0.7 mg/dL (ref 0.2–1.2)
BUN: 17 mg/dL (ref 6–23)
CO2: 24 meq/L (ref 19–32)
Calcium: 9.9 mg/dL (ref 8.4–10.5)
Chloride: 109 mEq/L (ref 96–112)
Creatinine, Ser: 0.96 mg/dL (ref 0.40–1.50)
GFR: 80.76 mL/min (ref >60.00)
GLUCOSE: 92 mg/dL (ref 70–99)
Potassium: 4.2 mEq/L (ref 3.5–5.1)
SODIUM: 139 meq/L (ref 135–145)
TOTAL PROTEIN: 6.6 g/dL (ref 6.0–8.3)

## 2015-05-30 LAB — PSA, MEDICARE: PSA: 0.06 ng/mL — AB (ref 0.10–4.00)

## 2015-05-30 LAB — LIPID PANEL
CHOL/HDL RATIO: 7
Cholesterol: 304 mg/dL — ABNORMAL HIGH (ref 0–200)
HDL: 41 mg/dL (ref >39.00)
NonHDL: 262.6
Triglycerides: 233 mg/dL — ABNORMAL HIGH (ref 0.0–149.0)
VLDL: 46.6 mg/dL — AB (ref 0.0–40.0)

## 2015-05-30 LAB — LDL CHOLESTEROL, DIRECT: Direct LDL: 182 mg/dL

## 2015-06-03 ENCOUNTER — Ambulatory Visit (INDEPENDENT_AMBULATORY_CARE_PROVIDER_SITE_OTHER): Payer: Medicare Other | Admitting: Family Medicine

## 2015-06-03 ENCOUNTER — Encounter: Payer: Self-pay | Admitting: Family Medicine

## 2015-06-03 VITALS — BP 150/66 | HR 67 | Temp 97.3°F | Ht >= 80 in | Wt 224.8 lb

## 2015-06-03 DIAGNOSIS — IMO0001 Reserved for inherently not codable concepts without codable children: Secondary | ICD-10-CM

## 2015-06-03 DIAGNOSIS — Z0000 Encounter for general adult medical examination without abnormal findings: Secondary | ICD-10-CM | POA: Diagnosis not present

## 2015-06-03 DIAGNOSIS — Z87898 Personal history of other specified conditions: Secondary | ICD-10-CM

## 2015-06-03 DIAGNOSIS — E785 Hyperlipidemia, unspecified: Secondary | ICD-10-CM

## 2015-06-03 NOTE — Progress Notes (Signed)
Pre visit review using our clinic review tool, if applicable. No additional management support is needed unless otherwise documented below in the visit note.  I have personally reviewed the Medicare Annual Wellness questionnaire and have noted 1. The patient's medical and social history 2. Their use of alcohol, tobacco or illicit drugs 3. Their current medications and supplements 4. The patient's functional ability including ADL's, fall risks, home safety risks and hearing or visual             impairment. 5. Diet and physical activities 6. Evidence for depression or mood disorders  The patients weight, height, BMI have been recorded in the chart and visual acuity is per eye clinic.  I have made referrals, counseling and provided education to the patient based review of the above and I have provided the pt with a written personalized care plan for preventive services.  Provider list updated- see scanned forms.  Routine anticipatory guidance given to patient.  See health maintenance.  Flu declined.  Shingles d/w pt.  He'll consider PNA declined Tetanus declined Colonoscopy 2015 PSA wnl 2017, d/w pt  Advance directive- wife designated if patient were incapacitated.  Cognitive function addressed- see scanned forms- and if abnormal then additional documentation follows.  Intentional weight loss.   D/w pt about lipids.  He'll work on diet and exercise and recheck in about 3 months.  He'll consider statin at that point.  Declined statin at this point.    PMH and SH reviewed  Meds, vitals, and allergies reviewed.   ROS: See HPI.  Otherwise negative.    GEN: nad, alert and oriented HEENT: mucous membranes moist NECK: supple w/o LA CV: rrr. PULM: ctab, no inc wob ABD: soft, +bs EXT: no edema SKIN:  Rash from coban on L arm at blood draw site. Not itching.

## 2015-06-03 NOTE — Assessment & Plan Note (Signed)
Advance directive- wife designated if patient were incapacitated.  

## 2015-06-03 NOTE — Patient Instructions (Addendum)
Take care.  Glad to see you.  Keep exercising.   Recheck fasting labs in about 3 months.  We'll go from there.

## 2015-06-03 NOTE — Assessment & Plan Note (Signed)
Flu declined.  Shingles d/w pt.  He'll consider PNA declined Tetanus declined Colonoscopy 2015 PSA wnl 2017, d/w pt  Advance directive- wife designated if patient were incapacitated.  Cognitive function addressed- see scanned forms- and if abnormal then additional documentation follows.  Intentional weight loss.   D/w pt about lipids.  He'll work on diet and exercise and recheck in about 3 months.  He'll consider statin at that point.  Declined statin at this point.

## 2015-06-05 ENCOUNTER — Encounter: Payer: Self-pay | Admitting: Podiatry

## 2015-06-05 ENCOUNTER — Ambulatory Visit: Payer: Medicare Other | Admitting: Podiatry

## 2015-06-05 ENCOUNTER — Ambulatory Visit (INDEPENDENT_AMBULATORY_CARE_PROVIDER_SITE_OTHER): Payer: Medicare Other | Admitting: Podiatry

## 2015-06-05 DIAGNOSIS — B351 Tinea unguium: Secondary | ICD-10-CM

## 2015-06-05 DIAGNOSIS — M79676 Pain in unspecified toe(s): Secondary | ICD-10-CM | POA: Diagnosis not present

## 2015-06-10 NOTE — Progress Notes (Signed)
Patient ID: Victor Smith, male   DOB: 05/23/1940, 74 y.o.   MRN: [id number] ° °Subjective: °74 y.o.-year-old male returns the office today for painful, elongated, thickened toenails which he is unable to trim himself. Denies any redness or drainage around the nails. Denies any acute changes since last appointment and no new complaints today. Denies any systemic complaints such as fevers, chills, nausea, vomiting.  ° °Objective: °AAO ×3, NAD °DP/PT pulses palpable, CRT less than 3 seconds °Nails hypertrophic, dystrophic, elongated, brittle, discolored ×10. There is tenderness overlying the nails 1-5 bilatearlly. There is no surrounding erythema or drainage along the nail sites. °No open lesions or pre-ulcerative lesions are identified. °No other areas of tenderness bilateral lower extremities. No overlying edema, erythema, increased warmth. °No pain with calf compression, swelling, warmth, erythema. ° °Assessment: °Patient presents with symptomatic onychomycosis ° °Plan: °-Treatment options including alternatives, risks, complications were discussed °-Nails sharply debrided ×10 without complication/bleeding. °-Discussed daily foot inspection. If there are any changes, to call the office immediately.  °-Follow-up in 3 months or sooner if any problems are to arise. In the meantime, encouraged to call the office with any questions, concerns, changes symptoms. ° °Matthew Wagoner, DPM °

## 2015-08-19 ENCOUNTER — Encounter: Payer: Self-pay | Admitting: Podiatry

## 2015-08-19 ENCOUNTER — Ambulatory Visit (INDEPENDENT_AMBULATORY_CARE_PROVIDER_SITE_OTHER): Payer: Medicare Other | Admitting: Podiatry

## 2015-08-19 DIAGNOSIS — M79676 Pain in unspecified toe(s): Secondary | ICD-10-CM

## 2015-08-19 DIAGNOSIS — B351 Tinea unguium: Secondary | ICD-10-CM

## 2015-08-19 NOTE — Progress Notes (Signed)
Patient ID: Victor Smith, male   DOB: 1940-10-03, 75 y.o.   MRN: [id number]  Subjective: 75 y.o.-year-old male returns the office today for painful, elongated, thickened toenails which he is unable to trim himself. Denies any redness or drainage around the nails. Denies any acute changes since last appointment and no new complaints today. Denies any systemic complaints such as fevers, chills, nausea, vomiting.   Objective: AAO 3, NAD DP/PT pulses palpable, CRT less than 3 seconds Nails hypertrophic, dystrophic, elongated, brittle, discolored 10. There is tenderness overlying the nails 1-5 bilatearlly. There is no surrounding erythema or drainage along the nail sites. No open lesions or pre-ulcerative lesions are identified. No other areas of tenderness bilateral lower extremities. No overlying edema, erythema, increased warmth. No pain with calf compression, swelling, warmth, erythema.  Assessment: Patient presents with symptomatic onychomycosis  Plan: -Treatment options including alternatives, risks, complications were discussed -Nails sharply debrided 10 without complication/bleeding. -Discussed daily foot inspection. If there are any changes, to call the office immediately.  -Follow-up in 3 months or sooner if any problems are to arise. In the meantime, encouraged to call the office with any questions, concerns, changes symptoms.   Celesta Gentile, DPM

## 2015-09-02 ENCOUNTER — Other Ambulatory Visit (INDEPENDENT_AMBULATORY_CARE_PROVIDER_SITE_OTHER): Payer: Medicare Other

## 2015-09-02 DIAGNOSIS — E785 Hyperlipidemia, unspecified: Secondary | ICD-10-CM

## 2015-09-02 LAB — LIPID PANEL
CHOLESTEROL: 267 mg/dL — AB (ref 0–200)
HDL: 39.8 mg/dL (ref >39.00)
LDL CALC: 192 mg/dL — AB (ref 0–99)
NonHDL: 226.74
TRIGLYCERIDES: 174 mg/dL — AB (ref 0.0–149.0)
Total CHOL/HDL Ratio: 7
VLDL: 34.8 mg/dL (ref 0.0–40.0)

## 2015-09-30 ENCOUNTER — Telehealth: Payer: Self-pay | Admitting: *Deleted

## 2015-09-30 DIAGNOSIS — E785 Hyperlipidemia, unspecified: Secondary | ICD-10-CM

## 2015-09-30 NOTE — Telephone Encounter (Signed)
Patient states that he was able to bring his cholesterol down from ~ 300 to ~ 260.  He has thought more about it and would like to start some medication, preferably the least amount possible, as he will be continuing to work on his diet and exercise.  Can you please send a Rx in to [place]?

## 2015-10-01 MED ORDER — PRAVASTATIN SODIUM 20 MG PO TABS: 10.0000 mg | ORAL_TABLET | Freq: Every day | ORAL | Status: DC

## 2015-10-01 NOTE — Addendum Note (Signed)
Addended by: Tonia Ghent on: 10/01/2015 10:09 AM   Modules accepted: Orders

## 2015-10-01 NOTE — Telephone Encounter (Signed)
Left message on voicemail for patient to call back. 

## 2015-10-01 NOTE — Telephone Encounter (Signed)
I would start pravastatin 20 tab.  Take 1/2 tab a day initially.  If able to tolerate w/o aches, then inc to 1 tab a day after about 1 month.   This is low dose of statin that is probably the least likely to cause aches.   Recheck labs in about 3 months.   Orders in, rx sent. Thanks.

## 2015-10-01 NOTE — Telephone Encounter (Signed)
Pt returned your call Best number [phone]

## 2015-10-01 NOTE — Telephone Encounter (Signed)
Left message on voice mail  to call back

## 2015-10-02 NOTE — Telephone Encounter (Signed)
Patient advised.  Lab appt scheduled.  

## 2015-10-28 ENCOUNTER — Ambulatory Visit (INDEPENDENT_AMBULATORY_CARE_PROVIDER_SITE_OTHER): Payer: Medicare Other | Admitting: Podiatry

## 2015-10-28 ENCOUNTER — Encounter: Payer: Self-pay | Admitting: Podiatry

## 2015-10-28 DIAGNOSIS — M79676 Pain in unspecified toe(s): Secondary | ICD-10-CM | POA: Diagnosis not present

## 2015-10-28 DIAGNOSIS — B351 Tinea unguium: Secondary | ICD-10-CM

## 2015-10-28 NOTE — Progress Notes (Signed)
Patient ID: Victor Smith, male   DOB: 06-03-1940, 75 y.o.   MRN: [id number]  Subjective: 75 y.o.-year-old male returns the office today for painful, elongated, thickened toenails which he is unable to trim himself. Denies any redness or drainage around the nails. Denies any acute changes since last appointment and no new complaints today. Denies any systemic complaints such as fevers, chills, nausea, vomiting.   Objective: AAO 3, NAD DP/PT pulses palpable, CRT less than 3 seconds Nails hypertrophic, dystrophic, elongated, brittle, discolored 10. There is tenderness overlying the nails 1-5 bilatearlly. There is no surrounding erythema or drainage along the nail sites. No open lesions or pre-ulcerative lesions are identified. No other areas of tenderness bilateral lower extremities. No overlying edema, erythema, increased warmth. No pain with calf compression, swelling, warmth, erythema.  Assessment: Patient presents with symptomatic onychomycosis  Plan: -Treatment options including alternatives, risks, complications were discussed -Nails sharply debrided 10 without complication/bleeding. -Discussed daily foot inspection. If there are any changes, to call the office immediately.  -Follow-up in 3 months or sooner if any problems are to arise. In the meantime, encouraged to call the office with any questions, concerns, changes symptoms.   Celesta Gentile, DPM

## 2015-11-20 ENCOUNTER — Ambulatory Visit: Payer: Medicare Other | Admitting: Podiatry

## 2016-01-02 ENCOUNTER — Other Ambulatory Visit (INDEPENDENT_AMBULATORY_CARE_PROVIDER_SITE_OTHER): Payer: Medicare Other

## 2016-01-02 DIAGNOSIS — E785 Hyperlipidemia, unspecified: Secondary | ICD-10-CM | POA: Diagnosis not present

## 2016-01-02 LAB — LDL CHOLESTEROL, DIRECT: Direct LDL: 123 mg/dL

## 2016-01-02 LAB — LIPID PANEL
CHOL/HDL RATIO: 5
Cholesterol: 213 mg/dL — ABNORMAL HIGH (ref 0–200)
HDL: 41.2 mg/dL (ref >39.00)
NONHDL: 171.58
Triglycerides: 201 mg/dL — ABNORMAL HIGH (ref 0.0–149.0)
VLDL: 40.2 mg/dL — AB (ref 0.0–40.0)

## 2016-01-08 ENCOUNTER — Ambulatory Visit (INDEPENDENT_AMBULATORY_CARE_PROVIDER_SITE_OTHER): Payer: Medicare Other | Admitting: Podiatry

## 2016-01-08 ENCOUNTER — Encounter: Payer: Self-pay | Admitting: Podiatry

## 2016-01-08 DIAGNOSIS — B351 Tinea unguium: Secondary | ICD-10-CM

## 2016-01-08 DIAGNOSIS — M79676 Pain in unspecified toe(s): Secondary | ICD-10-CM | POA: Diagnosis not present

## 2016-01-08 NOTE — Progress Notes (Signed)
Patient ID: Victor Smith, male   DOB: April 04, 1941, 75 y.o.   MRN: [id number]  Subjective: 75 y.o.-year-old male returns the office today for painful, elongated, thickened toenails which he is unable to trim himself. Denies any redness or drainage around the nails. Denies any acute changes since last appointment and no new complaints today. Denies any systemic complaints such as fevers, chills, nausea, vomiting.   Objective: AAO 3, NAD DP/PT pulses palpable, CRT less than 3 seconds Nails hypertrophic, dystrophic, elongated, brittle, discolored 10. There is tenderness overlying the nails 1-5 bilatearlly. There is no surrounding erythema or drainage along the nail sites. No open lesions or pre-ulcerative lesions are identified. No other areas of tenderness bilateral lower extremities. No overlying edema, erythema, increased warmth. No pain with calf compression, swelling, warmth, erythema.  Assessment: Patient presents with symptomatic onychomycosis  Plan: -Treatment options including alternatives, risks, complications were discussed -Nails sharply debrided 10 without complication/bleeding. -Discussed daily foot inspection. If there are any changes, to call the office immediately.  -Follow-up in 3 months or sooner if any problems are to arise. In the meantime, encouraged to call the office with any questions, concerns, changes symptoms.   Celesta Gentile, DPM

## 2016-01-10 ENCOUNTER — Ambulatory Visit (INDEPENDENT_AMBULATORY_CARE_PROVIDER_SITE_OTHER): Payer: Medicare Other

## 2016-01-10 ENCOUNTER — Ambulatory Visit
Admission: EM | Admit: 2016-01-10 | Discharge: 2016-01-10 | Disposition: A | Discharge status: Home / Self Care | Payer: Medicare Other | Attending: Family Medicine | Admitting: Family Medicine

## 2016-01-10 DIAGNOSIS — H6593 Unspecified nonsuppurative otitis media, bilateral: Secondary | ICD-10-CM

## 2016-01-10 DIAGNOSIS — J301 Allergic rhinitis due to pollen: Secondary | ICD-10-CM

## 2016-01-10 DIAGNOSIS — L089 Local infection of the skin and subcutaneous tissue, unspecified: Secondary | ICD-10-CM

## 2016-01-10 DIAGNOSIS — S20211A Contusion of right front wall of thorax, initial encounter: Secondary | ICD-10-CM

## 2016-01-10 DIAGNOSIS — S60811A Abrasion of right wrist, initial encounter: Secondary | ICD-10-CM | POA: Diagnosis not present

## 2016-01-10 MED ORDER — MUPIROCIN CALCIUM 2 % EX CREA: 1.0000 "application " | TOPICAL_CREAM | Freq: Two times a day (BID) | CUTANEOUS | 0 refills | Status: DC

## 2016-01-10 MED ORDER — ACETAMINOPHEN 500 MG PO TABS: 1000.0000 mg | ORAL_TABLET | Freq: Four times a day (QID) | ORAL | 0 refills | Status: AC | PRN

## 2016-01-10 MED ORDER — SULFAMETHOXAZOLE-TRIMETHOPRIM 800-160 MG PO TABS: 1.0000 | ORAL_TABLET | Freq: Two times a day (BID) | ORAL | 0 refills | Status: DC
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
## 2016-01-12 ENCOUNTER — Telehealth: Payer: Self-pay

## 2016-01-12 NOTE — Telephone Encounter (Signed)
Courtesy call back completed today after patient's visit at [facility]. Patient improved and will call back with any questions or concerns.  

## 2016-03-18 ENCOUNTER — Ambulatory Visit: Payer: Medicare Other | Admitting: Podiatry

## 2016-03-18 ENCOUNTER — Ambulatory Visit (INDEPENDENT_AMBULATORY_CARE_PROVIDER_SITE_OTHER): Payer: Medicare Other | Admitting: Podiatry

## 2016-03-18 ENCOUNTER — Encounter: Payer: Self-pay | Admitting: Podiatry

## 2016-03-18 DIAGNOSIS — L603 Nail dystrophy: Secondary | ICD-10-CM

## 2016-03-18 DIAGNOSIS — M79609 Pain in unspecified limb: Secondary | ICD-10-CM | POA: Diagnosis not present

## 2016-03-18 DIAGNOSIS — L608 Other nail disorders: Secondary | ICD-10-CM

## 2016-03-18 DIAGNOSIS — B351 Tinea unguium: Secondary | ICD-10-CM

## 2016-03-21 NOTE — Progress Notes (Signed)

## 2016-05-27 ENCOUNTER — Ambulatory Visit: Payer: Medicare Other | Admitting: Podiatry

## 2016-06-01 ENCOUNTER — Encounter: Payer: Self-pay | Admitting: Podiatry

## 2016-06-01 ENCOUNTER — Ambulatory Visit (INDEPENDENT_AMBULATORY_CARE_PROVIDER_SITE_OTHER): Payer: Medicare Other | Admitting: Podiatry

## 2016-06-01 DIAGNOSIS — L603 Nail dystrophy: Secondary | ICD-10-CM | POA: Diagnosis not present

## 2016-06-01 DIAGNOSIS — L608 Other nail disorders: Secondary | ICD-10-CM

## 2016-06-01 DIAGNOSIS — B351 Tinea unguium: Secondary | ICD-10-CM | POA: Diagnosis not present

## 2016-06-01 DIAGNOSIS — M79609 Pain in unspecified limb: Secondary | ICD-10-CM | POA: Diagnosis not present

## 2016-06-02 ENCOUNTER — Ambulatory Visit (INDEPENDENT_AMBULATORY_CARE_PROVIDER_SITE_OTHER): Payer: Medicare Other

## 2016-06-02 ENCOUNTER — Other Ambulatory Visit: Payer: Self-pay | Admitting: Family Medicine

## 2016-06-02 VITALS — BP 130/80 | HR 60 | Temp 97.6°F | Ht 77.0 in | Wt 224.0 lb

## 2016-06-02 DIAGNOSIS — E785 Hyperlipidemia, unspecified: Secondary | ICD-10-CM | POA: Diagnosis not present

## 2016-06-02 DIAGNOSIS — Z125 Encounter for screening for malignant neoplasm of prostate: Secondary | ICD-10-CM | POA: Diagnosis not present

## 2016-06-02 DIAGNOSIS — Z0000 Encounter for general adult medical examination without abnormal findings: Secondary | ICD-10-CM | POA: Diagnosis not present

## 2016-06-02 LAB — COMPREHENSIVE METABOLIC PANEL
ALK PHOS: 78 U/L (ref 39–117)
ALT: 19 U/L (ref 0–53)
AST: 16 U/L (ref 0–37)
Albumin: 4.3 g/dL (ref 3.5–5.2)
BUN: 18 mg/dL (ref 6–23)
CO2: 25 meq/L (ref 19–32)
Calcium: 10.2 mg/dL (ref 8.4–10.5)
Chloride: 109 mEq/L (ref 96–112)
Creatinine, Ser: 0.95 mg/dL (ref 0.40–1.50)
GFR: 81.53 mL/min (ref >60.00)
GLUCOSE: 96 mg/dL (ref 70–99)
POTASSIUM: 4.4 meq/L (ref 3.5–5.1)
Sodium: 140 mEq/L (ref 135–145)
TOTAL PROTEIN: 6.9 g/dL (ref 6.0–8.3)
Total Bilirubin: 0.7 mg/dL (ref 0.2–1.2)

## 2016-06-02 LAB — LIPID PANEL
Cholesterol: 270 mg/dL — ABNORMAL HIGH (ref 0–200)
HDL: 41 mg/dL (ref >39.00)
LDL Cholesterol: 193 mg/dL — ABNORMAL HIGH (ref 0–99)
NONHDL: 228.5
Total CHOL/HDL Ratio: 7
Triglycerides: 179 mg/dL — ABNORMAL HIGH (ref 0.0–149.0)
VLDL: 35.8 mg/dL (ref 0.0–40.0)

## 2016-06-02 LAB — PSA, MEDICARE: PSA: 0.06 ng/ml — ABNORMAL LOW (ref 0.10–4.00)

## 2016-06-02 NOTE — Progress Notes (Signed)
Pre visit review using our clinic review tool, if applicable. No additional management support is needed unless otherwise documented below in the visit note. 

## 2016-06-02 NOTE — Patient Instructions (Signed)
Victor Smith , Thank you for taking time to come for your Medicare Wellness Visit. I appreciate your ongoing commitment to your health goals. Please review the following plan we discussed and let me know if I can assist you in the future.   These are the goals we discussed: Goals    . Increase physical activity          Starting 06/02/2016, I will continue to exercise at least 60 min 3 days per week.        This is a list of the screening recommended for you and due dates:  Health Maintenance  Topic Date Due  . Flu Shot  06/02/2025*  . Tetanus Vaccine  06/02/2025*  . Pneumonia vaccines (1 of 2 - PCV13) 06/02/2025*  *Topic was postponed. The date shown is not the original due date.   Preventive Care for Adults  A healthy lifestyle and preventive care can promote health and wellness. Preventive health guidelines for adults include the following key practices.  . A routine yearly physical is a good way to check with your health care provider about your health and preventive screening. It is a chance to share any concerns and updates on your health and to receive a thorough exam.  . Visit your dentist for a routine exam and preventive care every 6 months. Brush your teeth twice a day and floss once a day. Good oral hygiene prevents tooth decay and gum disease.  . The frequency of eye exams is based on your age, health, family medical history, use  of contact lenses, and other factors. Follow your health care provider's ecommendations for frequency of eye exams.  . Eat a healthy diet. Foods like vegetables, fruits, whole grains, low-fat dairy products, and lean protein foods contain the nutrients you need without too many calories. Decrease your intake of foods high in solid fats, added sugars, and salt. Eat the right amount of calories for you. Get information about a proper diet from your health care provider, if necessary.  . Regular physical exercise is one of the most important  things you can do for your health. Most adults should get at least 150 minutes of moderate-intensity exercise (any activity that increases your heart rate and causes you to sweat) each week. In addition, most adults need muscle-strengthening exercises on 2 or more days a week.  Silver Sneakers may be a benefit available to you. To determine eligibility, you may visit the website: www.silversneakers.com or contact program at 818-702-9850 Mon-Fri between 8AM-8PM.   . Maintain a healthy weight. The body mass index (BMI) is a screening tool to identify possible weight problems. It provides an estimate of body fat based on height and weight. Your health care provider can find your BMI and can help you achieve or maintain a healthy weight.   For adults 20 years and older: ? A BMI below 18.5 is considered underweight. ? A BMI of 18.5 to 24.9 is normal. ? A BMI of 25 to 29.9 is considered overweight. ? A BMI of 30 and above is considered obese.   . Maintain normal blood lipids and cholesterol levels by exercising and minimizing your intake of saturated fat. Eat a balanced diet with plenty of fruit and vegetables. Blood tests for lipids and cholesterol should begin at age 43 and be repeated every 5 years. If your lipid or cholesterol levels are high, you are over 50, or you are at high risk for heart disease, you may need your cholesterol  levels checked more frequently. Ongoing high lipid and cholesterol levels should be treated with medicines if diet and exercise are not working.  . If you smoke, find out from your health care provider how to quit. If you do not use tobacco, please do not start.  . If you choose to drink alcohol, please do not consume more than 2 drinks per day. One drink is considered to be 12 ounces (355 mL) of beer, 5 ounces (148 mL) of wine, or 1.5 ounces (44 mL) of liquor.  . If you are 42-75 years old, ask your health care provider if you should take aspirin to prevent  strokes.  . Use sunscreen. Apply sunscreen liberally and repeatedly throughout the day. You should seek shade when your shadow is shorter than you. Protect yourself by wearing long sleeves, pants, a wide-brimmed hat, and sunglasses year round, whenever you are outdoors.  . Once a month, do a whole body skin exam, using a mirror to look at the skin on your back. Tell your health care provider of new moles, moles that have irregular borders, moles that are larger than a pencil eraser, or moles that have changed in shape or color.

## 2016-06-02 NOTE — Progress Notes (Signed)
Subjective:   Victor Smith is a 76 y.o. male who presents for Medicare Annual/Subsequent preventive examination.  Review of Systems:  N/A Cardiac Risk Factors include: advanced age (>22men, >91 women);male gender;dyslipidemia     Objective:    Vitals: BP 130/80 (BP Location: Right Arm, Patient Position: Sitting, Cuff Size: Normal)   Pulse 60   Temp 97.6 F (36.4 C) (Oral)   Ht 6\' 5"  (1.956 m) Comment: no shoes  Wt 224 lb (101.6 kg)   SpO2 95%   BMI 26.56 kg/m   Body mass index is 26.56 kg/m.  Tobacco History  Smoking Status  . Never Smoker  Smokeless Tobacco  . Never Used     Counseling given: No   Past Medical History:  Diagnosis Date  . Arthritis    knee, per Dr. Jefm Bryant  . Hyperlipidemia    Past Surgical History:  Procedure Laterality Date  . MEDIAL PARTIAL KNEE REPLACEMENT     left 2013  . VASECTOMY     Family History  Problem Relation Age of Onset  . Diabetes Mother     in 79s  . Hypertension Mother     in 36s  . Prostate cancer Father   . Cancer Brother     renal cancer  . Alcohol abuse Maternal Grandmother   . Colon cancer Neg Hx    History  Sexual Activity  . Sexual activity: Not on file    Outpatient Encounter Prescriptions as of 06/02/2016  Medication Sig  . Ascorbic Acid (VITAMIN C PO) Take 5,000 mg by mouth daily.  Marland Kitchen aspirin 81 MG tablet Take 81 mg by mouth every other day.   . fish oil-omega-3 fatty acids 1000 MG capsule 4000 mg daily  . Multiple Vitamin (MULTIVITAMIN) tablet Take 1 tablet by mouth daily.    . niacin (NIASPAN) 1000 MG CR tablet Take 2,000 mg daily.   . [DISCONTINUED] Ascorbic Acid (VITAMIN C) 1000 MG tablet 5,000 mg. Takes 4,000 mg. Daily.   . [DISCONTINUED] mupirocin cream (BACTROBAN) 2 % Apply 1 application topically 2 (two) times daily.  . [DISCONTINUED] pravastatin (PRAVACHOL) 20 MG tablet Take 0.5-1 tablets (10-20 mg total) by mouth daily.  . [DISCONTINUED] sulfamethoxazole-trimethoprim (BACTRIM DS,SEPTRA  DS) 800-160 MG tablet Take 1 tablet by mouth 2 (two) times daily.   No facility-administered encounter medications on file as of 06/02/2016.     Activities of Daily Living In your present state of health, do you have any difficulty performing the following activities: 06/02/2016  Hearing? Y  Vision? N  Difficulty concentrating or making decisions? N  Walking or climbing stairs? N  Dressing or bathing? N  Doing errands, shopping? N  Preparing Food and eating ? N  Using the Toilet? N  In the past six months, have you accidently leaked urine? N  Do you have problems with loss of bowel control? N  Managing your Medications? N  Managing your Finances? N  Housekeeping or managing your Housekeeping? N  Some recent data might be hidden    Patient Care Team: Tonia Ghent, MD as PCP - General (Family Medicine)   Assessment:     Hearing Screening   125Hz  250Hz  500Hz  1000Hz  2000Hz  3000Hz  4000Hz  6000Hz  8000Hz   Right ear:   40 0 0  0    Left ear:   40 0 0  0      Visual Acuity Screening   Right eye Left eye Both eyes  Without correction:     With  correction: 20/40 20/40 20/40     Exercise Activities and Dietary recommendations Current Exercise Habits: Home exercise routine, Type of exercise: strength training/weights;Other - see comments;stretching (aerobics, golf), Time (Minutes): > 60, Frequency (Times/Week): 3, Weekly Exercise (Minutes/Week): 0, Intensity: Moderate, Exercise limited by: None identified  Goals    . Increase physical activity          Starting 06/02/2016, I will continue to exercise at least 60 min 3 days per week.       Fall Risk Fall Risk  06/02/2016 06/03/2015 05/21/2013 03/28/2012 03/28/2012  Falls in the past year? No No No No No   Depression Screen PHQ 2/9 Scores 06/02/2016 06/03/2015 05/21/2013 03/28/2012  PHQ - 2 Score 0 0 0 0    Cognitive Function MMSE - Mini Mental State Exam 06/02/2016  Orientation to time 5  Orientation to Place 5  Registration 3    Attention/ Calculation 0  Recall 3  Language- name 2 objects 0  Language- repeat 1  Language- follow 3 step command 3  Language- read & follow direction 0  Write a sentence 0  Copy design 0  Total score 20     PLEASE NOTE: A Mini-Cog screen was completed. Maximum score is 20. A value of 0 denotes this part of Folstein MMSE was not completed or the patient failed this part of the Mini-Cog screening.   Mini-Cog Screening Orientation to Time - Max 5 pts Orientation to Place - Max 5 pts Registration - Max 3 pts Recall - Max 3 pts Language Repeat - Max 1 pts Language Follow 3 Step Command - Max 3 pts  There is no immunization history on file for this patient. Screening Tests Health Maintenance  Topic Date Due  . INFLUENZA VACCINE  06/02/2025 (Originally 11/04/2015)  . TETANUS/TDAP  06/02/2025 (Originally 04/06/2011)  . PNA vac Low Risk Adult (1 of 2 - PCV13) 06/02/2025 (Originally 08/04/2003)      Plan:     I have personally reviewed and addressed the Medicare Annual Wellness questionnaire and have noted the following in the patient's chart:  A. Medical and social history B. Use of alcohol, tobacco or illicit drugs  C. Current medications and supplements D. Functional ability and status E.  Nutritional status F.  Physical activity G. Advance directives H. List of other physicians I.  Hospitalizations, surgeries, and ER visits in previous 12 months J.  [place] to include hearing, vision, cognitive, depression L. Referrals and appointments - none  In addition, I have reviewed and discussed with patient certain preventive protocols, quality metrics, and best practice recommendations. A written personalized care plan for preventive services as well as general preventive health recommendations were provided to patient.  See attached scanned questionnaire for additional information.   Signed,   Lindell Noe, MHA, BS, LPN Health Coach
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
## 2016-06-11 ENCOUNTER — Ambulatory Visit (INDEPENDENT_AMBULATORY_CARE_PROVIDER_SITE_OTHER): Payer: Medicare Other | Admitting: Family Medicine

## 2016-06-11 ENCOUNTER — Encounter: Payer: Self-pay | Admitting: Family Medicine

## 2016-06-11 VITALS — BP 132/66 | HR 69 | Temp 97.5°F | Ht 77.0 in | Wt 232.8 lb

## 2016-06-11 DIAGNOSIS — E785 Hyperlipidemia, unspecified: Secondary | ICD-10-CM

## 2016-06-11 DIAGNOSIS — H9190 Unspecified hearing loss, unspecified ear: Secondary | ICD-10-CM

## 2016-06-11 DIAGNOSIS — Z0000 Encounter for general adult medical examination without abnormal findings: Secondary | ICD-10-CM

## 2016-06-11 NOTE — Assessment & Plan Note (Addendum)
Statin intolerant, continue diet and exercise.  Labs d/w pt.   >25 minutes spent in face to face time with patient, >50% spent in counselling or coordination of care.

## 2016-06-11 NOTE — Progress Notes (Signed)
Pre visit review using our clinic review tool, if applicable. No additional management support is needed unless otherwise documented below in the visit note. 

## 2016-06-11 NOTE — Progress Notes (Signed)
   SUBJECTIVE Patient  presents to office today complaining of elongated, thickened nails. Pain while ambulating in shoes. Patient is unable to trim their own nails.   OBJECTIVE General Patient is awake, alert, and oriented x 3 and in no acute distress. Derm Skin is dry and supple bilateral. Negative open lesions or macerations. Remaining integument unremarkable. Nails are tender, long, thickened and dystrophic with subungual debris, consistent with onychomycosis, 1-5 bilateral. No signs of infection noted. Vasc  DP and PT pedal pulses palpable bilaterally. Temperature gradient within normal limits.  Neuro Epicritic and protective threshold sensation diminished bilaterally.  Musculoskeletal Exam No symptomatic pedal deformities noted bilateral. Muscular strength within normal limits.  ASSESSMENT 1. Onychodystrophic nails 1-5 bilateral with hyperkeratosis of nails.  2. Onychomycosis of nail due to dermatophyte bilateral 3. Pain in foot bilateral  PLAN OF CARE 1. Patient evaluated today.  2. Instructed to maintain good pedal hygiene and foot care.  3. Mechanical debridement of nails 1-5 bilaterally performed using a nail nipper. Filed with dremel without incident.  4. Return to clinic in 3 mos.    Elycia Woodside M. Majour Frei, DPM [facility]  Dr. Tayden Nichelson M. Aprill Banko, DPM    [address]                                        [place], [place] [address]                Office [phone]  Fax [phone]      

## 2016-06-11 NOTE — Assessment & Plan Note (Signed)
Possibly contribution from recent URI and subsequent ETD, d/w pt.  If not better when ETD resolved, then he can follow up with audiology.  He agrees.

## 2016-06-11 NOTE — Patient Instructions (Signed)
Keep exercising.  Update me as needed.  Keep trying to gently pop your ears.  Take care.  Glad to see you.
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
## 2016-06-11 NOTE — Assessment & Plan Note (Signed)
Colonoscopy 2015.   PSA low, at [place], d/w pt.  No LUTS. Stream is okay per patient report.   Advance directive- wife designated if patient were incapacitated.  Patient has declined all vaccinations.  D/w pt.   Diet and exercise are good.  D/w pt.   Labs d/w pt.

## 2016-08-26 ENCOUNTER — Encounter: Payer: Self-pay | Admitting: Podiatry

## 2016-08-26 ENCOUNTER — Ambulatory Visit (INDEPENDENT_AMBULATORY_CARE_PROVIDER_SITE_OTHER): Payer: Medicare Other | Admitting: Podiatry

## 2016-08-26 DIAGNOSIS — B351 Tinea unguium: Secondary | ICD-10-CM | POA: Diagnosis not present

## 2016-08-26 DIAGNOSIS — M79609 Pain in unspecified limb: Secondary | ICD-10-CM

## 2016-08-26 NOTE — Progress Notes (Signed)
Complaint:  Visit Type: Patient returns to my office for continued preventative foot care services. Complaint: Patient states" my nails have grown long and thick and become painful to walk and wear shoes" . The patient presents for preventative foot care services. No changes to ROS  Podiatric Exam: Vascular: dorsalis pedis and posterior tibial pulses are palpable bilateral. Capillary return is immediate. Temperature gradient is WNL. Skin turgor WNL  Sensorium: Normal Semmes Weinstein monofilament test. Normal tactile sensation bilaterally. Nail Exam: Pt has thick disfigured discolored nails with subungual debris noted bilateral entire nail hallux through fifth toenails Ulcer Exam: There is no evidence of ulcer or pre-ulcerative changes or infection. Orthopedic Exam: Muscle tone and strength are WNL. No limitations in general ROM. No crepitus or effusions noted. Foot type and digits show no abnormalities. Bony prominences are unremarkable. Skin: No Porokeratosis. No infection or ulcers  Diagnosis:  Onychomycosis, , Pain in right toe, pain in left toes  Treatment & Plan Procedures and Treatment: Consent by patient was obtained for treatment procedures. The patient understood the discussion of treatment and procedures well. All questions were answered thoroughly reviewed. Debridement of mycotic and hypertrophic toenails, 1 through 5 bilateral and clearing of subungual debris. No ulceration, no infection noted.  Return Visit-Office Procedure: Patient instructed to return to the office for a follow up visit 10 weeks  for continued evaluation and treatment.    Berdella Bacot DPM 

## 2016-11-11 ENCOUNTER — Ambulatory Visit (INDEPENDENT_AMBULATORY_CARE_PROVIDER_SITE_OTHER): Payer: Medicare Other | Admitting: Podiatry

## 2016-11-11 ENCOUNTER — Encounter: Payer: Self-pay | Admitting: Podiatry

## 2016-11-11 DIAGNOSIS — M79609 Pain in unspecified limb: Secondary | ICD-10-CM

## 2016-11-11 DIAGNOSIS — B351 Tinea unguium: Secondary | ICD-10-CM | POA: Diagnosis not present

## 2016-11-11 NOTE — Progress Notes (Signed)
Complaint:  Visit Type: Patient returns to my office for continued preventative foot care services. Complaint: Patient states" my nails have grown long and thick and become painful to walk and wear shoes" . The patient presents for preventative foot care services. No changes to ROS  Podiatric Exam: Vascular: dorsalis pedis and posterior tibial pulses are palpable bilateral. Capillary return is immediate. Temperature gradient is WNL. Skin turgor WNL  Sensorium: Normal Semmes Weinstein monofilament test. Normal tactile sensation bilaterally. Nail Exam: Pt has thick disfigured discolored nails with subungual debris noted bilateral entire nail hallux through fifth toenails Ulcer Exam: There is no evidence of ulcer or pre-ulcerative changes or infection. Orthopedic Exam: Muscle tone and strength are WNL. No limitations in general ROM. No crepitus or effusions noted. Foot type and digits show no abnormalities. Bony prominences are unremarkable. Skin: No Porokeratosis. No infection or ulcers  Diagnosis:  Onychomycosis, , Pain in right toe, pain in left toes  Treatment & Plan Procedures and Treatment: Consent by patient was obtained for treatment procedures. The patient understood the discussion of treatment and procedures well. All questions were answered thoroughly reviewed. Debridement of mycotic and hypertrophic toenails, 1 through 5 bilateral and clearing of subungual debris. No ulceration, no infection noted.  Return Visit-Office Procedure: Patient instructed to return to the office for a follow up visit 10 weeks  for continued evaluation and treatment.    Madaleine Simmon DPM 

## 2016-11-29 ENCOUNTER — Ambulatory Visit: Payer: Medicare Other | Admitting: Podiatry

## 2017-01-20 ENCOUNTER — Ambulatory Visit (INDEPENDENT_AMBULATORY_CARE_PROVIDER_SITE_OTHER): Payer: Medicare Other | Admitting: Podiatry

## 2017-01-20 ENCOUNTER — Encounter: Payer: Self-pay | Admitting: Podiatry

## 2017-01-20 DIAGNOSIS — B351 Tinea unguium: Secondary | ICD-10-CM | POA: Diagnosis not present

## 2017-01-20 DIAGNOSIS — M79609 Pain in unspecified limb: Secondary | ICD-10-CM

## 2017-01-20 NOTE — Progress Notes (Signed)
Complaint:  Visit Type: Patient returns to my office for continued preventative foot care services. Complaint: Patient states" my nails have grown long and thick and become painful to walk and wear shoes" . The patient presents for preventative foot care services. No changes to ROS  Podiatric Exam: Vascular: dorsalis pedis and posterior tibial pulses are palpable bilateral. Capillary return is immediate. Temperature gradient is WNL. Skin turgor WNL  Sensorium: Normal Semmes Weinstein monofilament test. Normal tactile sensation bilaterally. Nail Exam: Pt has thick disfigured discolored nails with subungual debris noted bilateral entire nail hallux through fifth toenails Ulcer Exam: There is no evidence of ulcer or pre-ulcerative changes or infection. Orthopedic Exam: Muscle tone and strength are WNL. No limitations in general ROM. No crepitus or effusions noted. Foot type and digits show no abnormalities. Bony prominences are unremarkable. Skin: No Porokeratosis. No infection or ulcers  Diagnosis:  Onychomycosis, , Pain in right toe, pain in left toes  Treatment & Plan Procedures and Treatment: Consent by patient was obtained for treatment procedures. The patient understood the discussion of treatment and procedures well. All questions were answered thoroughly reviewed. Debridement of mycotic and hypertrophic toenails, 1 through 5 bilateral and clearing of subungual debris. No ulceration, no infection noted.  Return Visit-Office Procedure: Patient instructed to return to the office for a follow up visit 10 weeks  for continued evaluation and treatment.    Tiffanie Blassingame DPM 

## 2017-03-31 ENCOUNTER — Ambulatory Visit: Payer: Medicare Other | Admitting: Podiatry

## 2017-03-31 ENCOUNTER — Encounter: Payer: Self-pay | Admitting: Podiatry

## 2017-03-31 DIAGNOSIS — M79609 Pain in unspecified limb: Secondary | ICD-10-CM

## 2017-03-31 DIAGNOSIS — B351 Tinea unguium: Secondary | ICD-10-CM | POA: Diagnosis not present

## 2017-03-31 NOTE — Progress Notes (Signed)
Complaint:  Visit Type: Patient returns to my office for continued preventative foot care services. Complaint: Patient states" my nails have grown long and thick and become painful to walk and wear shoes" . The patient presents for preventative foot care services. No changes to ROS  Podiatric Exam: Vascular: dorsalis pedis and posterior tibial pulses are palpable bilateral. Capillary return is immediate. Temperature gradient is WNL. Skin turgor WNL  Sensorium: Normal Semmes Weinstein monofilament test. Normal tactile sensation bilaterally. Nail Exam: Pt has thick disfigured discolored nails with subungual debris noted bilateral entire nail hallux through fifth toenails Ulcer Exam: There is no evidence of ulcer or pre-ulcerative changes or infection. Orthopedic Exam: Muscle tone and strength are WNL. No limitations in general ROM. No crepitus or effusions noted. Foot type and digits show no abnormalities. Bony prominences are unremarkable. Skin: No Porokeratosis. No infection or ulcers  Diagnosis:  Onychomycosis, , Pain in right toe, pain in left toes  Treatment & Plan Procedures and Treatment: Consent by patient was obtained for treatment procedures. The patient understood the discussion of treatment and procedures well. All questions were answered thoroughly reviewed. Debridement of mycotic and hypertrophic toenails, 1 through 5 bilateral and clearing of subungual debris. No ulceration, no infection noted.  Return Visit-Office Procedure: Patient instructed to return to the office for a follow up visit 10 weeks  for continued evaluation and treatment.    Ronon Ferger DPM 

## 2017-06-30 ENCOUNTER — Ambulatory Visit: Payer: Medicare Other | Admitting: Podiatry

## 2017-06-30 ENCOUNTER — Encounter: Payer: Self-pay | Admitting: Podiatry

## 2017-06-30 DIAGNOSIS — M79609 Pain in unspecified limb: Secondary | ICD-10-CM

## 2017-06-30 DIAGNOSIS — B351 Tinea unguium: Secondary | ICD-10-CM

## 2017-06-30 NOTE — Progress Notes (Signed)
Complaint:  Visit Type: Patient returns to my office for continued preventative foot care services. Complaint: Patient states" my nails have grown long and thick and become painful to walk and wear shoes" The patient presents for preventative foot care services. No changes to ROS  Podiatric Exam: Vascular: dorsalis pedis and posterior tibial pulses are palpable bilateral. Capillary return is immediate. Temperature gradient is WNL. Skin turgor WNL  Sensorium: Normal Semmes Weinstein monofilament test. Normal tactile sensation bilaterally. Nail Exam: Pt has thick disfigured discolored nails with subungual debris noted bilateral entire nail hallux through fifth toenails Ulcer Exam: There is no evidence of ulcer or pre-ulcerative changes or infection. Orthopedic Exam: Muscle tone and strength are WNL. No limitations in general ROM. No crepitus or effusions noted. Foot type and digits show no abnormalities. Bony prominences are unremarkable. Skin: No Porokeratosis. No infection or ulcers  Diagnosis:  Onychomycosis, , Pain in right toe, pain in left toes  Treatment & Plan Procedures and Treatment: Consent by patient was obtained for treatment procedures. The patient understood the discussion of treatment and procedures well. All questions were answered thoroughly reviewed. Debridement of mycotic and hypertrophic toenails, 1 through 5 bilateral and clearing of subungual debris. No ulceration, no infection noted.  Return Visit-Office Procedure: Patient instructed to return to the office for a follow up visit 10 weeks  for continued evaluation and treatment.    Gardiner Barefoot DPM`1

## 2017-09-08 ENCOUNTER — Ambulatory Visit: Payer: Medicare Other | Admitting: Podiatry

## 2017-09-08 ENCOUNTER — Encounter: Payer: Self-pay | Admitting: Podiatry

## 2017-09-08 DIAGNOSIS — B351 Tinea unguium: Secondary | ICD-10-CM | POA: Diagnosis not present

## 2017-09-08 DIAGNOSIS — M79609 Pain in unspecified limb: Secondary | ICD-10-CM

## 2017-09-08 NOTE — Progress Notes (Signed)
Complaint:  Visit Type: Patient returns to my office for continued preventative foot care services. Complaint: Patient states" my nails have grown long and thick and become painful to walk and wear shoes" . The patient presents for preventative foot care services. No changes to ROS  Podiatric Exam: Vascular: dorsalis pedis and posterior tibial pulses are palpable bilateral. Capillary return is immediate. Temperature gradient is WNL. Skin turgor WNL  Sensorium: Normal Semmes Weinstein monofilament test. Normal tactile sensation bilaterally. Nail Exam: Pt has thick disfigured discolored nails with subungual debris noted bilateral entire nail hallux through fifth toenails Ulcer Exam: There is no evidence of ulcer or pre-ulcerative changes or infection. Orthopedic Exam: Muscle tone and strength are WNL. No limitations in general ROM. No crepitus or effusions noted. Foot type and digits show no abnormalities. Bony prominences are unremarkable. Skin: No Porokeratosis. No infection or ulcers  Diagnosis:  Onychomycosis, , Pain in right toe, pain in left toes  Treatment & Plan Procedures and Treatment: Consent by patient was obtained for treatment procedures. The patient understood the discussion of treatment and procedures well. All questions were answered thoroughly reviewed. Debridement of mycotic and hypertrophic toenails, 1 through 5 bilateral and clearing of subungual debris. No ulceration, no infection noted.  Return Visit-Office Procedure: Patient instructed to return to the office for a follow up visit 10 weeks  for continued evaluation and treatment.    Marsena Taff DPM 

## 2017-10-25 ENCOUNTER — Other Ambulatory Visit: Payer: Self-pay | Admitting: Family Medicine

## 2017-10-25 DIAGNOSIS — Z125 Encounter for screening for malignant neoplasm of prostate: Secondary | ICD-10-CM

## 2017-10-25 DIAGNOSIS — E785 Hyperlipidemia, unspecified: Secondary | ICD-10-CM

## 2017-10-26 ENCOUNTER — Other Ambulatory Visit (INDEPENDENT_AMBULATORY_CARE_PROVIDER_SITE_OTHER): Payer: Medicare Other

## 2017-10-26 DIAGNOSIS — E785 Hyperlipidemia, unspecified: Secondary | ICD-10-CM | POA: Diagnosis not present

## 2017-10-26 DIAGNOSIS — Z125 Encounter for screening for malignant neoplasm of prostate: Secondary | ICD-10-CM

## 2017-10-26 LAB — COMPREHENSIVE METABOLIC PANEL
ALBUMIN: 4.2 g/dL (ref 3.5–5.2)
ALK PHOS: 74 U/L (ref 39–117)
ALT: 23 U/L (ref 0–53)
AST: 19 U/L (ref 0–37)
BILIRUBIN TOTAL: 0.6 mg/dL (ref 0.2–1.2)
BUN: 17 mg/dL (ref 6–23)
CO2: 25 mEq/L (ref 19–32)
Calcium: 9.9 mg/dL (ref 8.4–10.5)
Chloride: 109 mEq/L (ref 96–112)
Creatinine, Ser: 0.98 mg/dL (ref 0.40–1.50)
GFR: 78.37 mL/min (ref >60.00)
GLUCOSE: 97 mg/dL (ref 70–99)
Potassium: 4.6 mEq/L (ref 3.5–5.1)
Sodium: 140 mEq/L (ref 135–145)
TOTAL PROTEIN: 6.8 g/dL (ref 6.0–8.3)

## 2017-10-26 LAB — LIPID PANEL
Cholesterol: 304 mg/dL — ABNORMAL HIGH (ref 0–200)
HDL: 41.9 mg/dL (ref >39.00)
NONHDL: 262.19
Total CHOL/HDL Ratio: 7
Triglycerides: 257 mg/dL — ABNORMAL HIGH (ref 0.0–149.0)
VLDL: 51.4 mg/dL — ABNORMAL HIGH (ref 0.0–40.0)

## 2017-10-26 LAB — PSA, MEDICARE: PSA: 0.04 ng/mL — AB (ref 0.10–4.00)

## 2017-10-26 LAB — LDL CHOLESTEROL, DIRECT: Direct LDL: 185 mg/dL

## 2017-11-01 ENCOUNTER — Encounter: Payer: Self-pay | Admitting: Family Medicine

## 2017-11-01 ENCOUNTER — Ambulatory Visit (INDEPENDENT_AMBULATORY_CARE_PROVIDER_SITE_OTHER): Payer: Medicare Other | Admitting: Family Medicine

## 2017-11-01 VITALS — BP 122/62 | HR 68 | Temp 97.8°F | Ht 77.0 in | Wt 237.0 lb

## 2017-11-01 DIAGNOSIS — Z0000 Encounter for general adult medical examination without abnormal findings: Secondary | ICD-10-CM | POA: Diagnosis not present

## 2017-11-01 DIAGNOSIS — E785 Hyperlipidemia, unspecified: Secondary | ICD-10-CM

## 2017-11-01 DIAGNOSIS — Z7189 Other specified counseling: Secondary | ICD-10-CM

## 2017-11-01 DIAGNOSIS — K4090 Unilateral inguinal hernia, without obstruction or gangrene, not specified as recurrent: Secondary | ICD-10-CM

## 2017-11-01 NOTE — Patient Instructions (Signed)
If you have more trouble with the hernia then let me know and we can get you an appointment with a general surgeon.  I would get a flu shot each fall.   Take care.  Glad to see you.

## 2017-11-01 NOTE — Progress Notes (Signed)
I have personally reviewed the Medicare Annual Wellness questionnaire and have noted 1. The patient's medical and social history 2. Their use of alcohol, tobacco or illicit drugs 3. Their current medications and supplements 4. The patient's functional ability including ADL's, fall risks, home safety risks and hearing or visual             impairment. 5. Diet and physical activities 6. Evidence for depression or mood disorders  The patients weight, height, BMI have been recorded in the chart and visual acuity is per eye clinic.  I have made referrals, counseling and provided education to the patient based review of the above and I have provided the pt with a written personalized care plan for preventive services.  Provider list updated- see scanned forms.  Routine anticipatory guidance given to patient.  See health maintenance. The possibility exists that previously documented standard health maintenance information may have been brought forward from a previous encounter into this note.  If needed, that same information has been updated to reflect the current situation based on today's encounter.    Flu declined.  Encouraged.   Shingles d/w pt.  He'll consider PNA declined Tetanus declined Vaccination d/w pt.  Colonoscopy 2015 PSA still low 2019 d/w pt.   Advance directive- wife designated if patient were incapacitated.  Cognitive function addressed- see scanned forms- and if abnormal then additional documentation follows.  Weight is stable/reasonable.  D/w pt.    His knee pain is controlled.  Cherry juice seemed to help some.  He'll update me as needed.   HLD.  Statin intolerant.  D/w pt.    He has a history of a right inguinal hernia.  He was getting more symptomatic but he did not have any emergent symptoms.  His discomfort got better when he changed the type of underwear he was wearing.  We talked about options.  See plan.  PMH and SH reviewed  Meds, vitals, and allergies  reviewed.   ROS: Per HPI.  Unless specifically indicated otherwise in HPI, the patient denies:  General: fever. Eyes: acute vision changes ENT: sore throat Cardiovascular: chest pain Respiratory: SOB GI: vomiting GU: dysuria Musculoskeletal: acute back pain Derm: acute rash Neuro: acute motor dysfunction Psych: worsening mood Endocrine: polydipsia Heme: bleeding Allergy: hayfever  GEN: nad, alert and oriented HEENT: mucous membranes moist NECK: supple w/o LA CV: rrr. PULM: ctab, no inc wob ABD: soft, +bs EXT: no edema SKIN: no acute rash Soft right inguinal hernia noted.  Not hard.  Not tender.  Easily reduced. Hard of hearing.

## 2017-11-02 DIAGNOSIS — K4090 Unilateral inguinal hernia, without obstruction or gangrene, not specified as recurrent: Secondary | ICD-10-CM

## 2017-11-02 DIAGNOSIS — Z7189 Other specified counseling: Secondary | ICD-10-CM | POA: Insufficient documentation

## 2017-11-02 HISTORY — DX: Unilateral inguinal hernia, without obstruction or gangrene, not specified as recurrent: K40.90

## 2017-11-02 NOTE — Assessment & Plan Note (Signed)
Flu declined.  Encouraged.   Shingles d/w pt.  He'll consider PNA declined Tetanus declined Vaccination d/w pt.  Colonoscopy 2015 PSA still low 2019 d/w pt.   Advance directive- wife designated if patient were incapacitated.  Cognitive function addressed- see scanned forms- and if abnormal then additional documentation follows.  Weight is stable/reasonable.  D/w pt.

## 2017-11-02 NOTE — Assessment & Plan Note (Signed)
He was getting more symptomatic but he did not have any emergent symptoms.  His discomfort got better when he changed the type of underwear he was wearing.  We talked about options.  Offered referral to general surgery.  He declined for now.  We can refer in the future if he becomes more symptomatic.  Routine cautions given.  He still able to play golf and do well in the meantime.  He recently shot around of golf where his score was lower than his age, which is remarkable for any player at any age.

## 2017-11-02 NOTE — Assessment & Plan Note (Signed)
Statin intolerant.  D/w pt.   Continue diet and exercise.  Labs d/w pt.

## 2017-11-02 NOTE — Assessment & Plan Note (Signed)
Advance directive- wife designated if patient were incapacitated.  

## 2017-11-24 ENCOUNTER — Encounter: Payer: Self-pay | Admitting: Podiatry

## 2017-11-24 ENCOUNTER — Ambulatory Visit: Payer: Medicare Other | Admitting: Podiatry

## 2017-11-24 DIAGNOSIS — M79609 Pain in unspecified limb: Principal | ICD-10-CM

## 2017-11-24 DIAGNOSIS — M79676 Pain in unspecified toe(s): Secondary | ICD-10-CM | POA: Diagnosis not present

## 2017-11-24 DIAGNOSIS — B351 Tinea unguium: Secondary | ICD-10-CM

## 2017-11-24 NOTE — Progress Notes (Signed)
Complaint:  Visit Type: Patient returns to my office for continued preventative foot care services. Complaint: Patient states" my nails have grown long and thick and become painful to walk and wear shoes" . The patient presents for preventative foot care services. No changes to ROS  Podiatric Exam: Vascular: dorsalis pedis and posterior tibial pulses are palpable bilateral. Capillary return is immediate. Temperature gradient is WNL. Skin turgor WNL  Sensorium: Normal Semmes Weinstein monofilament test. Normal tactile sensation bilaterally. Nail Exam: Pt has thick disfigured discolored nails with subungual debris noted bilateral entire nail hallux through fifth toenails Ulcer Exam: There is no evidence of ulcer or pre-ulcerative changes or infection. Orthopedic Exam: Muscle tone and strength are WNL. No limitations in general ROM. No crepitus or effusions noted. Foot type and digits show no abnormalities. Bony prominences are unremarkable. Skin: No Porokeratosis. No infection or ulcers  Diagnosis:  Onychomycosis, , Pain in right toe, pain in left toes  Treatment & Plan Procedures and Treatment: Consent by patient was obtained for treatment procedures. The patient understood the discussion of treatment and procedures well. All questions were answered thoroughly reviewed. Debridement of mycotic and hypertrophic toenails, 1 through 5 bilateral and clearing of subungual debris. No ulceration, no infection noted.  Return Visit-Office Procedure: Patient instructed to return to the office for a follow up visit 10 weeks  for continued evaluation and treatment.    Oryon Jais DPM 

## 2018-02-06 ENCOUNTER — Ambulatory Visit: Payer: Medicare Other | Admitting: Podiatry

## 2018-02-09 ENCOUNTER — Encounter: Payer: Self-pay | Admitting: Podiatry

## 2018-02-09 ENCOUNTER — Ambulatory Visit: Payer: Medicare Other | Admitting: Podiatry

## 2018-02-09 DIAGNOSIS — M79609 Pain in unspecified limb: Secondary | ICD-10-CM

## 2018-02-09 DIAGNOSIS — B351 Tinea unguium: Secondary | ICD-10-CM

## 2018-02-09 NOTE — Progress Notes (Signed)
Complaint:  Visit Type: Patient returns to my office for continued preventative foot care services. Complaint: Patient states" my nails have grown long and thick and become painful to walk and wear shoes" . The patient presents for preventative foot care services. No changes to ROS  Podiatric Exam: Vascular: dorsalis pedis and posterior tibial pulses are palpable bilateral. Capillary return is immediate. Temperature gradient is WNL. Skin turgor WNL  Sensorium: Normal Semmes Weinstein monofilament test. Normal tactile sensation bilaterally. Nail Exam: Pt has thick disfigured discolored nails with subungual debris noted bilateral entire nail hallux through fifth toenails Ulcer Exam: There is no evidence of ulcer or pre-ulcerative changes or infection. Orthopedic Exam: Muscle tone and strength are WNL. No limitations in general ROM. No crepitus or effusions noted. Foot type and digits show no abnormalities. Bony prominences are unremarkable. Skin: No Porokeratosis. No infection or ulcers  Diagnosis:  Onychomycosis, , Pain in right toe, pain in left toes  Treatment & Plan Procedures and Treatment: Consent by patient was obtained for treatment procedures. The patient understood the discussion of treatment and procedures well. All questions were answered thoroughly reviewed. Debridement of mycotic and hypertrophic toenails, 1 through 5 bilateral and clearing of subungual debris. No ulceration, no infection noted.  Return Visit-Office Procedure: Patient instructed to return to the office for a follow up visit 10 weeks  for continued evaluation and treatment.    Dhana Totton DPM 

## 2018-03-01 ENCOUNTER — Telehealth: Payer: Self-pay | Admitting: Family Medicine

## 2018-03-01 DIAGNOSIS — K4090 Unilateral inguinal hernia, without obstruction or gangrene, not specified as recurrent: Secondary | ICD-10-CM

## 2018-03-01 NOTE — Telephone Encounter (Signed)
Pt stated his hernia is bother him really bad and he need to be referred to a specialist. Please advise

## 2018-03-01 NOTE — Telephone Encounter (Signed)
Ordered. Thanks

## 2018-03-14 ENCOUNTER — Ambulatory Visit: Payer: Medicare Other | Admitting: Surgery

## 2018-03-14 ENCOUNTER — Encounter: Payer: Self-pay | Admitting: Surgery

## 2018-03-14 ENCOUNTER — Other Ambulatory Visit: Payer: Self-pay

## 2018-03-14 VITALS — BP 143/75 | HR 85 | Temp 97.2°F | Resp 20 | Ht 78.0 in | Wt 232.6 lb

## 2018-03-14 DIAGNOSIS — K4090 Unilateral inguinal hernia, without obstruction or gangrene, not specified as recurrent: Secondary | ICD-10-CM

## 2018-03-14 NOTE — Progress Notes (Signed)
Surgical Clinic History and Physical  Referring provider:  Tonia Ghent, MD [place], [place] [address]  HISTORY OF PRESENT ILLNESS (HPI):  77 y.o. male presents for evaluation of his Right inguinal hernia. Patient reports his Right inguinal hernia has been known x 20 - 30 years. Until 5 years ago, it only occasionally protruded without any pain, but it has over past 5 years been "out" more frequently, though it remained often spontaneously reducing and other times easily self-reducible manualy. Over the past 6 months, patient says his hernia over past 6 months his Right inguina hernia has been popping out and immediately "pops out" again after he manually reduces it. He's more recently experienced loose BM's, which he says were previously soft without constipation since before he met his wife, though he's been more recently experiencing occasional fecal incontinence. 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 He denies CP or SOB despite frequent activities including ascending/descending multiple flights of steps and walking.  PAST MEDICAL HISTORY (PMH):  Past Medical History:  Diagnosis Date  . Arthritis    knee, per Dr. Jefm Bryant  . Hyperlipidemia     PAST SURGICAL HISTORY ([place]):  Past Surgical History:  Procedure Laterality Date  . MEDIAL PARTIAL KNEE REPLACEMENT     left 2013  . VASECTOMY      MEDICATIONS:  Prior to Admission medications   Medication Sig Start Date End Date Taking? Authorizing Provider  Ascorbic Acid (VITAMIN C PO) Take 5,000 mg by mouth daily.   Yes [provider]  aspirin 81 MG tablet Take 81 mg by mouth every other day.    Yes [provider]  fish oil-omega-3 fatty acids 1000 MG capsule 5000 mg daily   Yes [provider]  Multiple Vitamin (MULTIVITAMIN) tablet Take 1 tablet by mouth daily.      Yes [provider]  niacin (NIASPAN) 1000 MG CR tablet Take 2,000 mg daily.    Yes [provider]    ALLERGIES:  Allergies  Allergen Reactions  . Latex     And coban & bandaids, causes a rash  . Statins Other (See Comments)    intolerant     SOCIAL HISTORY:  Social History   Socioeconomic History  . Marital status: Married    Spouse name: Not on file  . Number of children: Not on file  . Years of education: Not on file  . Highest education level: Not on file  Occupational History  . Not on file  Social Needs  . Financial resource strain: Not on file  . Food insecurity:    Worry: Not on file    Inability: Not on file  . Transportation needs:    Medical: Not on file    Non-medical: Not on file  Tobacco Use  . Smoking status: Never Smoker  . Smokeless tobacco: Never Used  Substance and Sexual Activity  . Alcohol use: No  . Drug use: No  . Sexual activity: Not on file  Lifestyle  . Physical activity:    Days per week: Not on file    Minutes per session: Not on file  . Stress: Not on file  Relationships  . Social connections:    Talks on phone: Not on file    Gets together: Not on file    Attends religious service: Not on file  Active member of club or organization: Not on file    Attends meetings of clubs or organizations: Not on file    Relationship status: Not on file  . Intimate partner violence:    Fear of current or ex partner: Not on file    Emotionally abused: Not on file    Physically abused: Not on file    Forced sexual activity: Not on file  Other Topics Concern  . 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 Diabetes  Mother        in 30s  . Hypertension Mother        in 39s  . Prostate cancer Father   . Cancer Brother        renal cancer  . Alcohol abuse Maternal Grandmother   . Colon cancer Neg Hx     Otherwise negative/non-contributory.  REVIEW OF SYSTEMS:  Constitutional: denies any other weight loss, fever, chills, or sweats  Eyes: denies any other vision changes, history of eye injury  ENT: denies sore throat, hearing problems  Respiratory: denies shortness of breath, wheezing  Cardiovascular: denies chest pain, palpitations  Gastrointestinal: abdominal pain, N/V, and bowel function as per HPI Musculoskeletal: denies any other joint pains or cramps  Skin: Denies any other rashes or skin discolorations except as per HPI Neurological: denies any other headache, dizziness, weakness  Psychiatric: Denies any other depression, anxiety   All other review of systems were otherwise negative   VITAL SIGNS:  BP (!) 143/75   Pulse 85   Temp (!) 97.2 F (36.2 C) (Temporal)   Resp 20   Ht '6\' 6"'  (1.981 m)   Wt 232 lb 9.6 oz (105.5 kg)   SpO2 98%   BMI 26.88 kg/m   PHYSICAL EXAM:  Constitutional:  -- Normal body habitus  -- Awake, alert, and oriented x3  Eyes:  -- Pupils equally round and reactive to light  -- No scleral icterus  Ear, nose, throat:  -- No jugular venous distension -- No nasal drainage, bleeding Pulmonary:  -- No crackles  -- Equal breath sounds bilaterally -- Breathing non-labored at rest Cardiovascular:  -- S1, S2 present  -- No pericardial rubs  Gastrointestinal:  -- Abdomen soft, nontender, and non-distended with no guarding/rebound tenderness -- No abdominal masses appreciated, pulsatile or otherwise, except mildly tender to palpation easily reducible large Right inguinal hernia Musculoskeletal and Integumentary:  -- Wounds or skin discoloration: None appreciated -- Extremities: B/L UE and LE FROM, hands and feet warm, no edema  Neurologic:  -- Motor  function: Intact and symmetric -- Sensation: Intact and symmetric  Labs:  CBC: No results found for: WBC, RBC  CMP Latest Ref Rng & Units 10/26/2017 06/02/2016 05/30/2015  Glucose 70 - 99 mg/dL 97 96 92  BUN 6 - 23 mg/dL '17 18 17  ' Creatinine 0.40 - 1.50 mg/dL 0.98 0.95 0.96  Sodium 135 - 145 mEq/L 140 140 139  Potassium 3.5 - 5.1 mEq/L 4.6 4.4 4.2  Chloride 96 - 112 mEq/L 109 109 109  CO2 19 - 32 mEq/L '25 25 24  ' Calcium 8.4 - 10.5 mg/dL 9.9 10.2 9.9  Total Protein 6.0 - 8.3 g/dL 6.8 6.9 6.6  Total Bilirubin 0.2 - 1.2 mg/dL 0.6 0.7 0.7  Alkaline Phos 39 - 117 U/L 74 78 74  AST 0 - 37 U/L '19 16 18  ' ALT 0 - 53 U/L '23 19 27   ' Imaging studies: No new pertinent imaging studies available for review at this time   Assessment/Plan: (ICD-10's: K40.9) 77 y.o. overall healthy male with an increasingly symptomatic easily reducible large Right inguinal hernia, complicated by pertinent comorbidities including advanced chronological age, HLD, and osteoarthritis.               - discussed with patient signs and symptoms of hernia incarceration and obstruction             - strategies for manual self-reduction of patient's Right inguinal hernia also reviewed and discussed  - maintain hydration with high fiber heart healthy diet to reduce/minimize constipation +/- daily stool softener as needed             - all risks, benefits, and alternatives to open repair of increasingly symptomatic easily reducible Right inguinal hernia with mesh were discussed with the patient, all of his questions were answered to patient's expressed satisfaction, patient expresses he wishes to proceed, and informed consent was obtained.             - will plan for open repair of Right inguinal hernia with mesh per patient request pending anesthesia and OR availability  - though medical contraindications to surgery in this active gentlemen seem unlikely, will confirm with primary care physician             - anticipate return to  clinic 2 weeks after above planned surgery             - instructed to call if any questions or concerns  All of the above recommendations were discussed with the patient, and all of patient's questions were answered to his expressed satisfaction.  Thank you for the opportunity to participate in this patient's care.  -- Marilynne Drivers Rosana Hoes, MD, [place]: [facility] - Partnering for exceptional care. Office: [phone]

## 2018-03-14 NOTE — H&P (View-Only) (Signed)
Surgical Clinic History and Physical  Referring provider:  Tonia Ghent, MD [place], [place] [address]  HISTORY OF PRESENT ILLNESS (HPI):  77 y.o. male presents for evaluation of his Right inguinal hernia. Patient reports his Right inguinal hernia has been known x 20 - 30 years. Until 5 years ago, it only occasionally protruded without any pain, but it has over past 5 years been "out" more frequently, though it remained often spontaneously reducing and other times easily self-reducible manualy. Over the past 6 months, patient says his hernia over past 6 months his Right inguina hernia has been popping out and immediately "pops out" again after he manually reduces it. He's more recently experienced loose BM's, which he says were previously soft without constipation since before he met his wife, though he's been more recently experiencing occasional fecal incontinence. 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 He denies CP or SOB despite frequent activities including ascending/descending multiple flights of steps and walking.  PAST MEDICAL HISTORY (PMH):  Past Medical History:  Diagnosis Date  . Arthritis    knee, per Dr. Jefm Bryant  . Hyperlipidemia     PAST SURGICAL HISTORY ([place]):  Past Surgical History:  Procedure Laterality Date  . MEDIAL PARTIAL KNEE REPLACEMENT     left 2013  . VASECTOMY      MEDICATIONS:  Prior to Admission medications   Medication Sig Start Date End Date Taking? Authorizing Provider  Ascorbic Acid (VITAMIN C PO) Take 5,000 mg by mouth daily.   Yes [provider]  aspirin 81 MG tablet Take 81 mg by mouth every other day.    Yes [provider]  fish oil-omega-3 fatty acids 1000 MG capsule 5000 mg daily   Yes [provider]  Multiple Vitamin (MULTIVITAMIN) tablet Take 1 tablet by mouth daily.      Yes [provider]  niacin (NIASPAN) 1000 MG CR tablet Take 2,000 mg daily.    Yes [provider]    ALLERGIES:  Allergies  Allergen Reactions  . Latex     And coban & bandaids, causes a rash  . Statins Other (See Comments)    intolerant     SOCIAL HISTORY:  Social History   Socioeconomic History  . Marital status: Married    Spouse name: Not on file  . Number of children: Not on file  . Years of education: Not on file  . Highest education level: Not on file  Occupational History  . Not on file  Social Needs  . Financial resource strain: Not on file  . Food insecurity:    Worry: Not on file    Inability: Not on file  . Transportation needs:    Medical: Not on file    Non-medical: Not on file  Tobacco Use  . Smoking status: Never Smoker  . Smokeless tobacco: Never Used  Substance and Sexual Activity  . Alcohol use: No  . Drug use: No  . Sexual activity: Not on file  Lifestyle  . Physical activity:    Days per week: Not on file    Minutes per session: Not on file  . Stress: Not on file  Relationships  . Social connections:    Talks on phone: Not on file    Gets together: Not on file    Attends religious service: Not on file  Active member of club or organization: Not on file    Attends meetings of clubs or organizations: Not on file    Relationship status: Not on file  . Intimate partner violence:    Fear of current or ex partner: Not on file    Emotionally abused: Not on file    Physically abused: Not on file    Forced sexual activity: Not on file  Other Topics Concern  . 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 Diabetes  Mother        in 9s  . Hypertension Mother        in 42s  . Prostate cancer Father   . Cancer Brother        renal cancer  . Alcohol abuse Maternal Grandmother   . Colon cancer Neg Hx     Otherwise negative/non-contributory.  REVIEW OF SYSTEMS:  Constitutional: denies any other weight loss, fever, chills, or sweats  Eyes: denies any other vision changes, history of eye injury  ENT: denies sore throat, hearing problems  Respiratory: denies shortness of breath, wheezing  Cardiovascular: denies chest pain, palpitations  Gastrointestinal: abdominal pain, N/V, and bowel function as per HPI Musculoskeletal: denies any other joint pains or cramps  Skin: Denies any other rashes or skin discolorations except as per HPI Neurological: denies any other headache, dizziness, weakness  Psychiatric: Denies any other depression, anxiety   All other review of systems were otherwise negative   VITAL SIGNS:  BP (!) 143/75   Pulse 85   Temp (!) 97.2 F (36.2 C) (Temporal)   Resp 20   Ht '6\' 6"'  (1.981 m)   Wt 232 lb 9.6 oz (105.5 kg)   SpO2 98%   BMI 26.88 kg/m   PHYSICAL EXAM:  Constitutional:  -- Normal body habitus  -- Awake, alert, and oriented x3  Eyes:  -- Pupils equally round and reactive to light  -- No scleral icterus  Ear, nose, throat:  -- No jugular venous distension -- No nasal drainage, bleeding Pulmonary:  -- No crackles  -- Equal breath sounds bilaterally -- Breathing non-labored at rest Cardiovascular:  -- S1, S2 present  -- No pericardial rubs  Gastrointestinal:  -- Abdomen soft, nontender, and non-distended with no guarding/rebound tenderness -- No abdominal masses appreciated, pulsatile or otherwise, except mildly tender to palpation easily reducible large Right inguinal hernia Musculoskeletal and Integumentary:  -- Wounds or skin discoloration: None appreciated -- Extremities: B/L UE and LE FROM, hands and feet warm, no edema  Neurologic:  -- Motor  function: Intact and symmetric -- Sensation: Intact and symmetric  Labs:  CBC: No results found for: WBC, RBC  CMP Latest Ref Rng & Units 10/26/2017 06/02/2016 05/30/2015  Glucose 70 - 99 mg/dL 97 96 92  BUN 6 - 23 mg/dL '17 18 17  ' Creatinine 0.40 - 1.50 mg/dL 0.98 0.95 0.96  Sodium 135 - 145 mEq/L 140 140 139  Potassium 3.5 - 5.1 mEq/L 4.6 4.4 4.2  Chloride 96 - 112 mEq/L 109 109 109  CO2 19 - 32 mEq/L '25 25 24  ' Calcium 8.4 - 10.5 mg/dL 9.9 10.2 9.9  Total Protein 6.0 - 8.3 g/dL 6.8 6.9 6.6  Total Bilirubin 0.2 - 1.2 mg/dL 0.6 0.7 0.7  Alkaline Phos 39 - 117 U/L 74 78 74  AST 0 - 37 U/L '19 16 18  ' ALT 0 - 53 U/L '23 19 27   ' Imaging studies: No new pertinent imaging studies available for review at this time   Assessment/Plan: (ICD-10's: K40.9) 77 y.o. overall healthy male with an increasingly symptomatic easily reducible large Right inguinal hernia, complicated by pertinent comorbidities including advanced chronological age, HLD, and osteoarthritis.               - discussed with patient signs and symptoms of hernia incarceration and obstruction             - strategies for manual self-reduction of patient's Right inguinal hernia also reviewed and discussed  - maintain hydration with high fiber heart healthy diet to reduce/minimize constipation +/- daily stool softener as needed             - all risks, benefits, and alternatives to open repair of increasingly symptomatic easily reducible Right inguinal hernia with mesh were discussed with the patient, all of his questions were answered to patient's expressed satisfaction, patient expresses he wishes to proceed, and informed consent was obtained.             - will plan for open repair of Right inguinal hernia with mesh per patient request pending anesthesia and OR availability  - though medical contraindications to surgery in this active gentlemen seem unlikely, will confirm with primary care physician             - anticipate return to  clinic 2 weeks after above planned surgery             - instructed to call if any questions or concerns  All of the above recommendations were discussed with the patient, and all of patient's questions were answered to his expressed satisfaction.  Thank you for the opportunity to participate in this patient's care.  -- Marilynne Drivers Rosana Hoes, MD, [place]: [facility] - Partnering for exceptional care. Office: [phone]

## 2018-03-14 NOTE — Patient Instructions (Addendum)
Patient is scheduled with Dr.Davis for hernia repair at [facility] on 03/27/18. He will pre admit at the hospital on 03/21/18 at 1:00 pm. The patient is aware of dates, time, and instructions.    Call the office with any questions or concerns.    Open Hernia Repair, Adult Open hernia repair is a surgical procedure to fix a hernia. A hernia occurs when an internal organ or tissue pushes out through a weak spot in the abdominal wall muscles. Hernias commonly occur in the groin and around the navel. Most hernias tend to get worse over time. Often, surgery is done to prevent the hernia from becoming bigger, uncomfortable, or an emergency. Emergency surgery may be needed if abdominal contents get stuck in the opening (incarcerated hernia) or the blood supply gets cut off (strangulated hernia). In an open repair, an incision is made in the abdomen to perform the surgery. Tell a health care provider about:  Any allergies you have.  All medicines you are taking, including vitamins, herbs, eye drops, creams, and over-the-counter medicines.  Any problems you or family members have had with anesthetic medicines.  Any blood or bone disorders you have.  Any surgeries you have had.  Any medical conditions you have, including any recent cold or flu symptoms.  Whether you are pregnant or may be pregnant. What are the risks? Generally, this is a safe procedure. However, problems may occur, including:  Long-lasting (chronic) pain.  Bleeding.  Infection.  Damage to the testicle. This can cause shrinking or swelling.  Damage to the bladder, blood vessels, intestine, or nerves near the hernia.  Trouble passing urine.  Allergic reactions to medicines.  Return of the hernia.  What happens before the procedure? Staying hydrated Follow instructions from your health care provider about hydration, which may include:  Up to 2 hours before the procedure - you may continue to drink clear liquids, such as  water, clear fruit juice, black coffee, and plain tea.  Eating and drinking restrictions Follow instructions from your health care provider about eating and drinking, which may include:  8 hours before the procedure - stop eating heavy meals or foods such as meat, fried foods, or fatty foods.  6 hours before the procedure - stop eating light meals or foods, such as toast or cereal.  6 hours before the procedure - stop drinking milk or drinks that contain milk.  2 hours before the procedure - stop drinking clear liquids.  Medicines  Ask your health care provider about: ? Changing or stopping your regular medicines. This is especially important if you are taking diabetes medicines or blood thinners. ? Taking medicines such as aspirin and ibuprofen. These medicines can thin your blood. Do not take these medicines before your procedure if your health care provider instructs you not to.  You may be given antibiotic medicine to help prevent infection. General instructions  You may have blood tests or imaging studies.  Ask your health care provider how your surgical site will be marked or identified.  If you smoke, do not smoke for at least 2 weeks before your procedure or for as long as told by your health care provider.  Let your health care provider know if you develop a cold or any infection before your surgery.  Plan to have someone take you home from the hospital or clinic.  If you will be going home right after the procedure, plan to have someone with you for 24 hours. What happens during the procedure?  To reduce your risk of infection: ? Your health care team will wash or sanitize their hands. ? Your skin will be washed with soap. ? Hair may be removed from the surgical area.  An IV tube will be inserted into one of your veins.  You will be given one or more of the following: ? A medicine to help you relax (sedative). ? A medicine to numb the area (local  anesthetic). ? A medicine to make you fall asleep (general anesthetic).  Your surgeon will make an incision over the hernia.  The tissues of the hernia will be moved back into place.  The edges of the hernia may be stitched together.  The opening in the abdominal muscles will be closed with stitches (sutures). Or, your surgeon will place a mesh patch made of manmade (synthetic) material over the opening.  The incision will be closed.  A bandage (dressing) may be placed over the incision. The procedure may vary among health care providers and hospitals. What happens after the procedure?  Your blood pressure, heart rate, breathing rate, and blood oxygen level will be monitored until the medicines you were given have worn off.  You may be given medicine for pain.  Do not drive for 24 hours if you received a sedative. This information is not intended to replace advice given to you by your health care provider. Make sure you discuss any questions you have with your health care provider. Document Released: 09/15/2000 Document Revised: 10/10/2015 Document Reviewed: 09/03/2015 Elsevier Interactive Patient Education  [employer].

## 2018-03-15 ENCOUNTER — Telehealth: Payer: Self-pay | Admitting: Family Medicine

## 2018-03-15 NOTE — Telephone Encounter (Signed)
Noted.  I'll defer to them.  Thanks.

## 2018-03-15 NOTE — Telephone Encounter (Signed)
Please set up a preop visit in the near future.  He is going to have a hernia repair.  Thanks.

## 2018-03-15 NOTE — Telephone Encounter (Signed)
Patient notified as instructed by telephone and verbalized understanding. Patient stated that he does not need a pre-op by Dr. Damita Dunnings because he is scheduled to go to the [facility] at the [facility] Tuesday to have the pre-op workup done there. Patient stated that he is very pleased with who he was sent to.

## 2018-03-17 ENCOUNTER — Ambulatory Visit: Payer: Medicare Other | Admitting: Surgery

## 2018-03-20 NOTE — Addendum Note (Signed)
Addended by: Priscille Heidelberg on: 03/20/2018 11:47 AM   Modules accepted: Orders, SmartSet

## 2018-03-21 ENCOUNTER — Other Ambulatory Visit: Payer: Self-pay

## 2018-03-21 ENCOUNTER — Encounter
Admission: RE | Admit: 2018-03-21 | Discharge: 2018-03-21 | Disposition: A | Discharge status: Home / Self Care | Payer: Medicare Other | Source: Ambulatory Visit | Attending: Surgery | Admitting: Surgery

## 2018-03-21 DIAGNOSIS — Z01818 Encounter for other preprocedural examination: Secondary | ICD-10-CM | POA: Diagnosis not present

## 2018-03-21 DIAGNOSIS — E785 Hyperlipidemia, unspecified: Secondary | ICD-10-CM | POA: Insufficient documentation

## 2018-03-21 LAB — CBC WITH DIFFERENTIAL/PLATELET
ABS IMMATURE GRANULOCYTES: 0.02 10*3/uL (ref 0.00–0.07)
Basophils Absolute: 0 10*3/uL (ref 0.0–0.1)
Basophils Relative: 0 %
Eosinophils Absolute: 0.1 10*3/uL (ref 0.0–0.5)
Eosinophils Relative: 2 %
HCT: 39.4 % (ref 39.0–52.0)
Hemoglobin: 12.9 g/dL — ABNORMAL LOW (ref 13.0–17.0)
Immature Granulocytes: 0 %
Lymphocytes Relative: 30 %
Lymphs Abs: 2.1 10*3/uL (ref 0.7–4.0)
MCH: 28.4 pg (ref 26.0–34.0)
MCHC: 32.7 g/dL (ref 30.0–36.0)
MCV: 86.6 fL (ref 80.0–100.0)
Monocytes Absolute: 0.4 10*3/uL (ref 0.1–1.0)
Monocytes Relative: 5 %
Neutro Abs: 4.3 10*3/uL (ref 1.7–7.7)
Neutrophils Relative %: 63 %
Platelets: 253 10*3/uL (ref 150–400)
RBC: 4.55 MIL/uL (ref 4.22–5.81)
RDW: 14 % (ref 11.5–15.5)
WBC: 6.9 10*3/uL (ref 4.0–10.5)
nRBC: 0 % (ref 0.0–0.2)

## 2018-03-21 LAB — BASIC METABOLIC PANEL
Anion gap: 9 (ref 5–15)
BUN: 18 mg/dL (ref 8–23)
CO2: 22 mmol/L (ref 22–32)
Calcium: 9.7 mg/dL (ref 8.9–10.3)
Chloride: 109 mmol/L (ref 98–111)
Creatinine, Ser: 1.01 mg/dL (ref 0.61–1.24)
GFR calc Af Amer: >60 mL/min (ref >60)
GFR calc non Af Amer: >60 mL/min (ref >60)
Glucose, Bld: 92 mg/dL (ref 70–99)
POTASSIUM: 4.2 mmol/L (ref 3.5–5.1)
Sodium: 140 mmol/L (ref 135–145)

## 2018-03-21 NOTE — Patient Instructions (Signed)
Your procedure is scheduled on: Monday 03/27/18 Report to [place]. To find out your arrival time please call [phone] between 1PM - 3PM on Friday 03/24/18.  Remember: Instructions that are not followed completely may result in serious medical risk, up to and including death, or upon the discretion of your surgeon and anesthesiologist your surgery may need to be rescheduled.     _X__ 1. Do not eat food after midnight the night before your procedure.                 No gum chewing or hard candies. You may drink clear liquids up to 2 hours                 before you are scheduled to arrive for your surgery- DO not drink clear                 liquids within 2 hours of the start of your surgery.                 Clear Liquids include:  water, apple juice without pulp, clear carbohydrate                 drink such as Clearfast or Gatorade, Black Coffee or Tea (Do not add                 anything to coffee or tea).  __X__2.  On the morning of surgery brush your teeth with toothpaste and water, you                 may rinse your mouth with mouthwash if you wish.  Do not swallow any              toothpaste of mouthwash.     _X__ 3.  No Alcohol for 24 hours before or after surgery.   _X__ 4.  Do Not Smoke or use e-cigarettes For 24 Hours Prior to Your Surgery.                 Do not use any chewable tobacco products for at least 6 hours prior to                 surgery.  ____  5.  Bring all medications with you on the day of surgery if instructed.   __X__  6.  Notify your doctor if there is any change in your medical condition      (cold, fever, infections).     Do not wear jewelry, make-up, hairpins, clips or nail polish. Do not wear lotions, powders, or perfumes.  Do not shave 48 hours prior to surgery. Men may shave face and neck. Do not bring valuables to the hospital.    [facility] is not responsible for any belongings or  valuables.  Contacts, dentures/partials or body piercings may not be worn into surgery. Bring a case for your contacts, glasses or hearing aids, a denture cup will be supplied. Leave your suitcase in the car. After surgery it may be brought to your room. For patients admitted to the hospital, discharge time is determined by your treatment team.   Patients discharged the day of surgery will not be allowed to drive home.   Please read over the following fact sheets that you were given:   MRSA Information  __X__ Take these medicines the morning of surgery with A SIP OF WATER:  1. None  2.   3.   4.  5.  6.  ____ Fleet Enema (as directed)   __X__ Use CHG Soap/SAGE wipes as directed  ____ Use inhalers on the day of surgery  ____ Stop metformin/Janumet/Farxiga 2 days prior to surgery    ____ Take 1/2 of usual insulin dose the night before surgery. No insulin the morning          of surgery.   ____ Stop Blood Thinners Coumadin/Plavix/Xarelto/Pleta/Pradaxa/Eliquis/Effient/Aspirin  on   Or contact your Surgeon, Cardiologist or Medical Doctor regarding  ability to stop your blood thinners  __X__ Stop Anti-inflammatories 7 days before surgery such as Advil, Ibuprofen, Motrin,  BC or Goodies Powder, Naprosyn, Naproxen, Aleve, Aspirin    __X__ Stop all herbal supplements, fish oil or vitamin E until after surgery.    ____ Bring C-Pap to the hospital.

## 2018-03-27 ENCOUNTER — Encounter: Payer: Self-pay | Admitting: *Deleted

## 2018-03-27 ENCOUNTER — Encounter
Admission: RE | Disposition: A | Discharge status: Home / Self Care | Payer: Self-pay | Source: Ambulatory Visit | Attending: Surgery

## 2018-03-27 ENCOUNTER — Other Ambulatory Visit: Payer: Self-pay

## 2018-03-27 ENCOUNTER — Ambulatory Visit: Payer: Medicare Other | Admitting: Anesthesiology

## 2018-03-27 ENCOUNTER — Ambulatory Visit
Admission: RE | Admit: 2018-03-27 | Discharge: 2018-03-27 | Disposition: A | Discharge status: Home / Self Care | Payer: Medicare Other | Source: Ambulatory Visit | Attending: Surgery | Admitting: Surgery

## 2018-03-27 DIAGNOSIS — K4090 Unilateral inguinal hernia, without obstruction or gangrene, not specified as recurrent: Secondary | ICD-10-CM | POA: Diagnosis present

## 2018-03-27 DIAGNOSIS — E785 Hyperlipidemia, unspecified: Secondary | ICD-10-CM | POA: Insufficient documentation

## 2018-03-27 DIAGNOSIS — M1990 Unspecified osteoarthritis, unspecified site: Secondary | ICD-10-CM | POA: Diagnosis not present

## 2018-03-27 DIAGNOSIS — Z7982 Long term (current) use of aspirin: Secondary | ICD-10-CM | POA: Insufficient documentation

## 2018-03-27 HISTORY — PX: INGUINAL HERNIA REPAIR: SHX194

## 2018-03-27 SURGERY — REPAIR, HERNIA, INGUINAL, ADULT
Anesthesia: General | Laterality: Right

## 2018-03-27 MED ORDER — LIDOCAINE HCL (PF) 1 % IJ SOLN
INTRAMUSCULAR | Status: AC
  Filled 2018-03-27: qty 30

## 2018-03-27 MED ORDER — PROPOFOL 10 MG/ML IV BOLUS
INTRAVENOUS | Status: AC
  Filled 2018-03-27: qty 20

## 2018-03-27 MED ORDER — BUPIVACAINE HCL (PF) 0.5 % IJ SOLN
INTRAMUSCULAR | Status: AC
  Filled 2018-03-27: qty 30

## 2018-03-27 MED ORDER — DEXAMETHASONE SODIUM PHOSPHATE 4 MG/ML IJ SOLN
INTRAMUSCULAR | Status: DC | PRN
  Administered 2018-03-27: 6 mg via INTRAVENOUS

## 2018-03-27 MED ORDER — LIDOCAINE HCL (CARDIAC) PF 100 MG/5ML IV SOSY
PREFILLED_SYRINGE | INTRAVENOUS | Status: DC | PRN
  Administered 2018-03-27: 100 mg via INTRATRACHEAL

## 2018-03-27 MED ORDER — CHLORHEXIDINE GLUCONATE CLOTH 2 % EX PADS: 6.0000 | MEDICATED_PAD | Freq: Once | CUTANEOUS | Status: DC

## 2018-03-27 MED ORDER — ACETAMINOPHEN 500 MG PO TABS
ORAL_TABLET | ORAL | Status: AC
  Administered 2018-03-27: 1000 mg via ORAL
  Filled 2018-03-27: qty 2

## 2018-03-27 MED ORDER — LACTATED RINGERS IV SOLN
INTRAVENOUS | Status: DC
  Administered 2018-03-27: 11:00:00 via INTRAVENOUS

## 2018-03-27 MED ORDER — SUGAMMADEX SODIUM 200 MG/2ML IV SOLN
INTRAVENOUS | Status: DC | PRN
  Administered 2018-03-27: 200 mg via INTRAVENOUS

## 2018-03-27 MED ORDER — PROPOFOL 10 MG/ML IV BOLUS
INTRAVENOUS | Status: DC | PRN
  Administered 2018-03-27: 160 mg via INTRAVENOUS
  Administered 2018-03-27: 40 mg via INTRAVENOUS

## 2018-03-27 MED ORDER — DEXAMETHASONE SODIUM PHOSPHATE 10 MG/ML IJ SOLN
INTRAMUSCULAR | Status: AC
  Filled 2018-03-27: qty 1

## 2018-03-27 MED ORDER — FAMOTIDINE 20 MG PO TABS
ORAL_TABLET | ORAL | Status: AC
  Administered 2018-03-27: 20 mg via ORAL
  Filled 2018-03-27: qty 1

## 2018-03-27 MED ORDER — ROCURONIUM BROMIDE 50 MG/5ML IV SOLN
INTRAVENOUS | Status: AC
  Filled 2018-03-27: qty 1

## 2018-03-27 MED ORDER — FENTANYL CITRATE (PF) 100 MCG/2ML IJ SOLN: 25.0000 ug | INTRAMUSCULAR | Status: DC | PRN

## 2018-03-27 MED ORDER — LIDOCAINE HCL 1 % IJ SOLN
INTRAMUSCULAR | Status: DC | PRN
  Administered 2018-03-27: 20 mL

## 2018-03-27 MED ORDER — FENTANYL CITRATE (PF) 100 MCG/2ML IJ SOLN
INTRAMUSCULAR | Status: AC
  Filled 2018-03-27: qty 2

## 2018-03-27 MED ORDER — LIDOCAINE HCL (PF) 2 % IJ SOLN
INTRAMUSCULAR | Status: AC
  Filled 2018-03-27: qty 10

## 2018-03-27 MED ORDER — EPHEDRINE SULFATE 50 MG/ML IJ SOLN
INTRAMUSCULAR | Status: DC | PRN
  Administered 2018-03-27: 15 mg via INTRAVENOUS

## 2018-03-27 MED ORDER — SUGAMMADEX SODIUM 200 MG/2ML IV SOLN: INTRAVENOUS | Status: DC | PRN

## 2018-03-27 MED ORDER — FAMOTIDINE 20 MG PO TABS
20.0000 mg | ORAL_TABLET | Freq: Once | ORAL | Status: AC
  Administered 2018-03-27: 20 mg via ORAL

## 2018-03-27 MED ORDER — FENTANYL CITRATE (PF) 100 MCG/2ML IJ SOLN
INTRAMUSCULAR | Status: DC | PRN
  Administered 2018-03-27: 100 ug via INTRAVENOUS
  Administered 2018-03-27 (×4): 25 ug via INTRAVENOUS

## 2018-03-27 MED ORDER — OXYCODONE-ACETAMINOPHEN 5-325 MG PO TABS: 1.0000 | ORAL_TABLET | ORAL | 0 refills | Status: DC | PRN

## 2018-03-27 MED ORDER — CEFAZOLIN SODIUM-DEXTROSE 2-4 GM/100ML-% IV SOLN
INTRAVENOUS | Status: AC
  Filled 2018-03-27: qty 100

## 2018-03-27 MED ORDER — CEFAZOLIN SODIUM-DEXTROSE 2-4 GM/100ML-% IV SOLN
2.0000 g | INTRAVENOUS | Status: AC
  Administered 2018-03-27: 2 g via INTRAVENOUS

## 2018-03-27 MED ORDER — ONDANSETRON HCL 4 MG/2ML IJ SOLN
INTRAMUSCULAR | Status: DC | PRN
  Administered 2018-03-27: 4 mg via INTRAVENOUS

## 2018-03-27 MED ORDER — ROCURONIUM BROMIDE 100 MG/10ML IV SOLN
INTRAVENOUS | Status: DC | PRN
  Administered 2018-03-27 (×3): 10 mg via INTRAVENOUS
  Administered 2018-03-27: 40 mg via INTRAVENOUS

## 2018-03-27 MED ORDER — ACETAMINOPHEN 500 MG PO TABS
1000.0000 mg | ORAL_TABLET | ORAL | Status: AC
  Administered 2018-03-27: 1000 mg via ORAL

## 2018-03-27 MED ORDER — ONDANSETRON HCL 4 MG/2ML IJ SOLN: 4.0000 mg | Freq: Once | INTRAMUSCULAR | Status: DC | PRN

## 2018-03-27 MED ORDER — ONDANSETRON HCL 4 MG/2ML IJ SOLN
INTRAMUSCULAR | Status: AC
  Filled 2018-03-27: qty 2

## 2018-03-27 SURGICAL SUPPLY — 33 items
BLADE SURG 15 STRL LF DISP TIS (BLADE) ×1 IMPLANT
BLADE SURG 15 STRL SS (BLADE) ×2
CHLORAPREP W/TINT 26ML (MISCELLANEOUS) ×3 IMPLANT
COVER WAND RF STERILE (DRAPES) ×3 IMPLANT
DECANTER SPIKE VIAL GLASS SM (MISCELLANEOUS) ×6 IMPLANT
DERMABOND ADVANCED (GAUZE/BANDAGES/DRESSINGS) ×2
DERMABOND ADVANCED .7 DNX12 (GAUZE/BANDAGES/DRESSINGS) ×1 IMPLANT
DRAIN PENROSE 5/8X18 LTX STRL (WOUND CARE) ×3 IMPLANT
DRAPE LAPAROTOMY 77X122 PED (DRAPES) ×3 IMPLANT
ELECT REM PT RETURN 9FT ADLT (ELECTROSURGICAL) ×3
ELECTRODE REM PT RTRN 9FT ADLT (ELECTROSURGICAL) ×1 IMPLANT
GLOVE BIO SURGEON STRL SZ7 (GLOVE) ×3 IMPLANT
GLOVE BIOGEL PI IND STRL 7.5 (GLOVE) ×1 IMPLANT
GLOVE BIOGEL PI INDICATOR 7.5 (GLOVE) ×2
GOWN STRL REUS W/ TWL LRG LVL3 (GOWN DISPOSABLE) ×2 IMPLANT
GOWN STRL REUS W/TWL LRG LVL3 (GOWN DISPOSABLE) ×4
KIT TURNOVER KIT A (KITS) ×3 IMPLANT
MESH MARLEX PLUG MEDIUM (Mesh General) ×2 IMPLANT
NEEDLE HYPO 22GX1.5 SAFETY (NEEDLE) ×3 IMPLANT
NS IRRIG 500ML POUR BTL (IV SOLUTION) ×3 IMPLANT
PACK BASIN MINOR ARMC (MISCELLANEOUS) ×3 IMPLANT
SUT ETHIBOND CT1 BRD 2-0 30IN (SUTURE) ×3 IMPLANT
SUT ETHIBOND NAB MO 7 #0 18IN (SUTURE) ×3 IMPLANT
SUT MNCRL AB 4-0 PS2 18 (SUTURE) ×3 IMPLANT
SUT SILK 2 0 (SUTURE)
SUT SILK 2-0 18XBRD TIE 12 (SUTURE) IMPLANT
SUT VIC AB 2-0 CT1 27 (SUTURE) ×2
SUT VIC AB 2-0 CT1 TAPERPNT 27 (SUTURE) ×1 IMPLANT
SUT VIC AB 3-0 54X BRD REEL (SUTURE) IMPLANT
SUT VIC AB 3-0 BRD 54 (SUTURE)
SUT VIC AB 3-0 SH 27 (SUTURE) ×2
SUT VIC AB 3-0 SH 27X BRD (SUTURE) ×1 IMPLANT
SYR 10ML LL (SYRINGE) ×3 IMPLANT

## 2018-03-27 NOTE — Anesthesia Post-op Follow-up Note (Signed)
Anesthesia QCDR form completed.        

## 2018-03-27 NOTE — Anesthesia Procedure Notes (Signed)
Procedure Name: Intubation Date/Time: 03/27/2018 2:33 PM Performed by: Babs Sciara, CRNA Pre-anesthesia Checklist: Emergency Drugs available, Suction available, Patient identified, Patient being monitored and Timeout performed Patient Re-evaluated:Patient Re-evaluated prior to induction Oxygen Delivery Method: Circle system utilized Preoxygenation: Pre-oxygenation with 100% oxygen Induction Type: IV induction Ventilation: Mask ventilation without difficulty and Oral airway inserted - appropriate to patient size Laryngoscope Size: Mac and 4 Grade View: Grade II Tube type: Oral Tube size: 7.5 mm Number of attempts: 1 Airway Equipment and Method: Stylet Placement Confirmation: ETT inserted through vocal cords under direct vision,  positive ETCO2 and breath sounds checked- equal and bilateral Secured at: 21 (lip) cm Dental Injury: Teeth and Oropharynx as per pre-operative assessment

## 2018-03-27 NOTE — Interval H&P Note (Signed)
History and Physical Interval Note:  03/27/2018 1:55 PM  Victor Smith  has presented today for surgery, with the diagnosis of INCREASINGLY SYMPTOMATIC RIGHT INGUINAL HERNIA  The various methods of treatment have been discussed with the patient and family. After consideration of risks, benefits and other options for treatment, the patient has consented to  Procedure(s): OPEN HERNIA REPAIR INGUINAL ADULT WITH MESH (Right) as a surgical intervention .  The patient's history has been reviewed, patient examined, no change in status, stable for surgery.  I have reviewed the patient's chart and labs.  Questions were answered to the patient's satisfaction.     Vickie Epley

## 2018-03-27 NOTE — Anesthesia Procedure Notes (Signed)
Date/Time: 03/27/2018 2:33 PM Performed by: Babs Sciara, CRNA

## 2018-03-27 NOTE — Anesthesia Preprocedure Evaluation (Addendum)
Anesthesia Evaluation  Patient identified by MRN, date of birth, ID band Patient awake    Reviewed: Allergy & Precautions, H&P , NPO status , Patient's Chart, lab work & pertinent test results, reviewed documented beta blocker date and time   History of Anesthesia Complications Negative for: history of anesthetic complications  Airway Mallampati: II  TM Distance: >3 FB Neck ROM: full    Dental  (+) Dental Advidsory Given, Partial Lower, Teeth Intact, Missing   Pulmonary neg pulmonary ROS,           Cardiovascular Exercise Tolerance: Good negative cardio ROS       Neuro/Psych negative neurological ROS  negative psych ROS   GI/Hepatic negative GI ROS, Neg liver ROS,   Endo/Other  negative endocrine ROS  Renal/GU negative Renal ROS  negative genitourinary   Musculoskeletal   Abdominal   Peds  Hematology negative hematology ROS (+)   Anesthesia Other Findings Past Medical History: No date: Arthritis     Comment:  knee, per Dr. Jefm Bryant No date: Hyperlipidemia   Reproductive/Obstetrics negative OB ROS                             Anesthesia Physical Anesthesia Plan  ASA: II  Anesthesia Plan: General   Post-op Pain Management:    Induction: Intravenous  PONV Risk Score and Plan: 2 and Ondansetron, Dexamethasone, Midazolam and Treatment may vary due to age or medical condition  Airway Management Planned: Oral ETT  Additional Equipment:   Intra-op Plan:   Post-operative Plan: Extubation in OR  Informed Consent: I have reviewed the patients History and Physical, chart, labs and discussed the procedure including the risks, benefits and alternatives for the proposed anesthesia with the patient or authorized representative who has indicated his/her understanding and acceptance.   Dental Advisory Given  Plan Discussed with: Anesthesiologist, CRNA and Surgeon  Anesthesia Plan  Comments:         Anesthesia Quick Evaluation

## 2018-03-27 NOTE — Transfer of Care (Signed)
Immediate Anesthesia Transfer of Care Note  Patient: Victor Smith  Procedure(s) Performed: OPEN HERNIA REPAIR INGUINAL ADULT WITH MESH (Right )  Patient Location: PACU  Anesthesia Type:General  Level of Consciousness: awake and alert   Airway & Oxygen Therapy: Patient Spontanous Breathing and Patient connected to face mask oxygen  Post-op Assessment: Report given to RN and Post -op Vital signs reviewed and stable  Post vital signs: Reviewed and stable  Last Vitals:  Vitals Value Taken Time  BP 153/75 03/27/2018  4:30 PM  Temp    Pulse 78 03/27/2018  4:32 PM  Resp 22 03/27/2018  4:32 PM  SpO2 97 % 03/27/2018  4:32 PM  Vitals shown include unvalidated device data.  Last Pain:  Vitals:   03/27/18 1002  TempSrc: Tympanic         Complications: No apparent anesthesia complications

## 2018-03-27 NOTE — Discharge Instructions (Addendum)
In addition to included general post-operative instructions for Open Repair of Right Inguinal Hernia with mesh,  Diet: Resume home heart healthy diet.   Activity: No heavy lifting >15 - 20 pounds (children, pets, laundry, garbage) or strenuous activity until follow-up, but light activity and walking are encouraged. Do not drive or drink alcohol if taking narcotic pain medications.  Wound care: 2 days after surgery (Wednesday afternoon, 12/25), you may shower/get incision wet with soapy water and pat dry (do not rub incisions), but no baths or submerging incision underwater until follow-up.   Medications: Resume all home medications. For mild to moderate pain: acetaminophen (Tylenol) or ibuprofen/naproxen (if no kidney disease). Combining Tylenol with alcohol can substantially increase your risk of causing liver disease. Narcotic pain medications, if prescribed, can be used for severe pain, though may cause nausea, constipation, and drowsiness. Do not combine Tylenol and Percocet (or similar) within a 6 hour period as Percocet (and similar) contain(s) Tylenol. If you do not need the narcotic pain medication, you do not need to fill the prescription.  Call office [phone]) at any time if any questions, worsening pain, fevers/chills, bleeding, drainage from incision site, or other concerns.  AMBULATORY SURGERY  DISCHARGE INSTRUCTIONS   1) The drugs that you were given will stay in your system until tomorrow so for the next 24 hours you should not:  A) Drive an automobile B) Make any legal decisions C) Drink any alcoholic beverage   2) You may resume regular meals tomorrow.  Today it is better to start with liquids and gradually work up to solid foods.  You may eat anything you prefer, but it is better to start with liquids, then soup and crackers, and gradually work up to solid foods.   3) Please notify your doctor immediately if you have any unusual bleeding, trouble breathing, redness  and pain at the surgery site, drainage, fever, or pain not relieved by medication.    4) Additional Instructions:   Please contact your physician with any problems or Same Day Surgery at [phone], Monday through Friday 6 am to 4 pm, or [place] at [facility] number at [phone].

## 2018-03-27 NOTE — Op Note (Signed)
SURGICAL OPERATIVE REPORT  DATE OF PROCEDURE: 03/27/2018  ATTENDING Surgeon(s): Vickie Epley, MD  ANESTHESIA: GETA  PRE-OPERATIVE DIAGNOSIS: Increasingly symptomatic (painful) reducible Right inguinal hernia (icd-10: K40.90)  POST-OPERATIVE DIAGNOSIS: Increasingly symptomatic (painful) reducible Right inguinal hernia (icd-10: K40.90)  PROCEDURE(S):  1.) Repair of increasingly symptomatic (painful) reducible Right inguinal hernia with mesh (cpt: 36629)  INTRAOPERATIVE FINDINGS: Large indirect Right inguinal hernia containing large amount of omentum at the time of surgery  INTRAVENOUS FLUIDS: 1050 mL crystalloid   ESTIMATED BLOOD LOSS: Minimal (<20 mL)  URINE OUTPUT: No foley  SPECIMENS: None  IMPLANTS: Bard Soft polypropylene mesh patch  DRAINS: None   COMPLICATIONS: None apparent   CONDITION AT END OF PROCEDURE: Hemodynamically stable and extubated   DISPOSITION OF PATIENT: PACU  INDICATIONS FOR PROCEDURE:   Patient is a 77 y.o. male who presented with Right groin pain x 5 years, which over the past 6 months has become more frequently "out" and increasingly painful. On exam, a Right inguinal hernia was identified, contributing factors were assessed, and operative repair was offered. All risks, benefits, and alternatives to above procedure were discussed with the patient, all of patient's questions were answered to his expressed satisfaction, and informed consent was obtained and documented.  DETAILS OF PROCEDURE: Patient was brought to the operating suite and appropriately identified. General anesthesia was administered along with appropriate pre-operative antibiotics, and endotracheal intubation was performed by anesthetist. In supine position, operative site was prepped and draped in the usual sterile fashion, and following a brief time out, inguinal nerve block was performed and local anesthetic was injected over the planned incision site and initial transverse skin  incision was made along a natural skin crease using a #15 blade scalpel 1-2 cm superior to the inguinal ligament as determined by identifying and marking the anterior superior iliac spine and pubic tubercle. This incision was then extended deep through subcutaneous tissues, Scarpa's and Camper's fascia, using electrocautery until external oblique aponeurosis was encountered and the external ring was exposed. A small incision was made in the mid- external oblique aponeurosis in the direction of its fibers, elevated off underlying structures, and extended medially and laterally. The ilioinguinal nerve was identified and protected throughout the remainder of dissection and procedure, and superior and inferior external oblique flaps were developed bluntly.  The spermatic cord was safely encircled with a penrose drain and carefully dissected free from the anteromedial hernia sac, which was reduced. Attention was then directed to the floor of the inguinal canal, which appeared weakened without a well-defined defect. An appropriately fashioned mesh patch was sutured inferiorly to the shelving edge of the inguinal ligament, starting medially at the pubic tubercle, using interrupted 2-0 Ethibond suture and superiorly running 2-0 Ethibond suture(s) to the conjoint tendon/transversalis muscle/fascia. Care was taken to assure the mesh was placed in a relaxed fashion without excessive tension and without any neurovascular structures incorporated into the repair. Laterally, the tails of the mesh were then crossed to recreate the internal ring. Hemostasis was confirmed, and the incision was closed in multiple layers using a running 2-0 Vicryl suture to re-approximate the external oblique aponeurosis, interrupted 3-0 Vicryl deep dermal sutures, and running 4-0 Monocryl subcuticular suture/surgical skin staples to re-approximate epidermis. The skin was then cleaned, dried, and sterile skin glue was applied. Patient was then safely  able to be extubated, awakened, and transferred to PACU for post-operative monitoring and care.  I was present for all aspects of the above procedure, and no operative complications were apparent.

## 2018-03-27 NOTE — Anesthesia Postprocedure Evaluation (Signed)
Anesthesia Post Note  Patient: Victor Smith  Procedure(s) Performed: OPEN HERNIA REPAIR INGUINAL ADULT WITH MESH (Right )  Patient location during evaluation: PACU Anesthesia Type: General Level of consciousness: awake and alert Pain management: pain level controlled Vital Signs Assessment: post-procedure vital signs reviewed and stable Respiratory status: spontaneous breathing and respiratory function stable Cardiovascular status: stable Anesthetic complications: no     Last Vitals:  Vitals:   03/27/18 1712 03/27/18 1813  BP: (!) 159/74 (!) 172/68  Pulse: 64 65  Resp:  16  Temp: 36.8 C   SpO2: 100% 99%    Last Pain:  Vitals:   03/27/18 1813  TempSrc:   PainSc: 2                  KEPHART,WILLIAM K

## 2018-03-28 ENCOUNTER — Encounter: Payer: Self-pay | Admitting: Surgery
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
## 2018-04-11 ENCOUNTER — Encounter: Payer: Self-pay | Admitting: Surgery

## 2018-04-11 ENCOUNTER — Other Ambulatory Visit: Payer: Self-pay

## 2018-04-11 ENCOUNTER — Ambulatory Visit (INDEPENDENT_AMBULATORY_CARE_PROVIDER_SITE_OTHER): Payer: Medicare Other | Admitting: Surgery

## 2018-04-11 VITALS — BP 130/70 | HR 68 | Temp 98.0°F | Resp 12 | Ht >= 80 in | Wt 232.0 lb

## 2018-04-11 DIAGNOSIS — K4090 Unilateral inguinal hernia, without obstruction or gangrene, not specified as recurrent: Secondary | ICD-10-CM

## 2018-04-11 DIAGNOSIS — Z4889 Encounter for other specified surgical aftercare: Secondary | ICD-10-CM

## 2018-04-11 NOTE — Progress Notes (Signed)
Surgical Clinic Progress/Follow-up Note   HPI:  78 y.o. very pleasant and active Male presents to clinic for post-op follow-up 15 Days s/p open repair of increasingly symptomatic Right inguinal hernia with mesh Rosana Hoes, 03/27/2018). Patient reports complete resolution of pre-operative pain and has been tolerating regular diet with +flatus and normal BM's, denies N/V, fever/chills, CP, or SOB.  Review of Systems:  Constitutional: denies fever/chills  Respiratory: denies shortness of breath, wheezing  Cardiovascular: denies chest pain, palpitations  Gastrointestinal: abdominal pain, N/V, and bowel function as per interval history Skin: Denies any other rashes or skin discolorations except post-surgical wounds as per interval history  Vital Signs:  BP 130/70   Pulse 68   Temp 98 F (36.7 C) (Skin)   Resp 12   Ht 6\' 8"  (2.032 m)   Wt 232 lb (105.2 kg)   SpO2 98%   BMI 25.49 kg/m    Physical Exam:  Constitutional:  -- Normal body habitus, tall  -- Awake, alert, and oriented x3  Pulmonary:  -- No crackles -- Equal breath sounds bilaterally -- Breathing non-labored at rest Cardiovascular:  -- S1, S2 present  -- No pericardial rubs  Gastrointestinal:  -- Soft and non-distended, non-tender to palpation, no guarding/rebound tenderness -- Post-surgical Right groin incision well-approximated without any peri-incisional erythema or drainage -- No abdominal masses appreciated, pulsatile or otherwise  Musculoskeletal / Integumentary:  -- Wounds or skin discoloration: None appreciated except post-surgical incisions as described above (GI) -- Extremities: B/L UE and LE FROM, hands and feet warm, no edema   Imaging: No new pertinent imaging available for review  Assessment:  78 y.o. yo Male with a problem list including...  Patient Active Problem List   Diagnosis Date Noted  . Right inguinal hernia 11/02/2017  . Advance care planning 11/02/2017  . Hearing loss 06/11/2016  .  Healthcare maintenance 06/11/2016  . Medicare annual wellness visit, subsequent 06/03/2015  . Living will on file 06/04/2014  . Gynecomastia 05/22/2013  . Other malaise and fatigue 09/27/2012  . HLD (hyperlipidemia) 01/21/2011  . OA (osteoarthritis) 01/21/2011  . FH: prostate cancer 01/21/2011    presents to clinic for post-op follow-up evaluation, doing very well 15 Days s/p open repair of increasingly symptomatic Right inguinal hernia with mesh Rosana Hoes, 03/27/2018).  Plan:              - advance diet as tolerated              - okay to submerge incisions under water (baths, swimming) prn             - no heavy lifting >40 lbs x 4 weeks, after which and otherwise may gradually resume activities             - apply sunblock particularly to incisions with sun exposure to reduce pigmentation of scars             - return to clinic as needed, instructed to call office if any questions or concerns  All of the above recommendations were discussed with the patient, and all of patient's questions were answered to his expressed satisfaction.  -- Marilynne Drivers Rosana Hoes, MD, [place]: [facility] - Partnering for exceptional care. Office: [phone]

## 2018-04-11 NOTE — Patient Instructions (Addendum)
Return as needed. No lifting over 40 pounds for the next month. The patient is aware to call back for any questions or concerns.

## 2018-04-12 ENCOUNTER — Encounter: Payer: Self-pay | Admitting: Surgery

## 2018-04-20 ENCOUNTER — Encounter: Payer: Self-pay | Admitting: Podiatry

## 2018-04-20 ENCOUNTER — Ambulatory Visit: Payer: Medicare Other | Admitting: Podiatry

## 2018-04-20 DIAGNOSIS — B351 Tinea unguium: Secondary | ICD-10-CM

## 2018-04-20 DIAGNOSIS — M79676 Pain in unspecified toe(s): Secondary | ICD-10-CM | POA: Diagnosis not present

## 2018-04-20 DIAGNOSIS — M79609 Pain in unspecified limb: Principal | ICD-10-CM

## 2018-04-20 NOTE — Progress Notes (Signed)
Complaint:  Visit Type: Patient returns to my office for continued preventative foot care services. Complaint: Patient states" my nails have grown long and thick and become painful to walk and wear shoes" . The patient presents for preventative foot care services. No changes to ROS  Podiatric Exam: Vascular: dorsalis pedis and posterior tibial pulses are palpable bilateral. Capillary return is immediate. Temperature gradient is WNL. Skin turgor WNL  Sensorium: Normal Semmes Weinstein monofilament test. Normal tactile sensation bilaterally. Nail Exam: Pt has thick disfigured discolored nails with subungual debris noted bilateral entire nail hallux through fifth toenails Ulcer Exam: There is no evidence of ulcer or pre-ulcerative changes or infection. Orthopedic Exam: Muscle tone and strength are WNL. No limitations in general ROM. No crepitus or effusions noted. Foot type and digits show no abnormalities. Bony prominences are unremarkable. Skin: No Porokeratosis. No infection or ulcers  Diagnosis:  Onychomycosis, , Pain in right toe, pain in left toes  Treatment & Plan Procedures and Treatment: Consent by patient was obtained for treatment procedures. The patient understood the discussion of treatment and procedures well. All questions were answered thoroughly reviewed. Debridement of mycotic and hypertrophic toenails, 1 through 5 bilateral and clearing of subungual debris. No ulceration, no infection noted.  Return Visit-Office Procedure: Patient instructed to return to the office for a follow up visit 10 weeks  for continued evaluation and treatment.    Randa Riss DPM 

## 2018-06-29 ENCOUNTER — Other Ambulatory Visit: Payer: Self-pay

## 2018-06-29 ENCOUNTER — Ambulatory Visit: Payer: Medicare Other | Admitting: Podiatry

## 2018-06-29 ENCOUNTER — Encounter: Payer: Self-pay | Admitting: Podiatry

## 2018-06-29 DIAGNOSIS — M79609 Pain in unspecified limb: Principal | ICD-10-CM

## 2018-06-29 DIAGNOSIS — M79676 Pain in unspecified toe(s): Secondary | ICD-10-CM | POA: Diagnosis not present

## 2018-06-29 DIAGNOSIS — B351 Tinea unguium: Secondary | ICD-10-CM

## 2018-06-29 NOTE — Progress Notes (Signed)
Complaint:  Visit Type: Patient returns to my office for continued preventative foot care services. Complaint: Patient states" my nails have grown long and thick and become painful to walk and wear shoes" . The patient presents for preventative foot care services. No changes to ROS  Podiatric Exam: Vascular: dorsalis pedis and posterior tibial pulses are palpable bilateral. Capillary return is immediate. Temperature gradient is WNL. Skin turgor WNL  Sensorium: Normal Semmes Weinstein monofilament test. Normal tactile sensation bilaterally. Nail Exam: Pt has thick disfigured discolored nails with subungual debris noted bilateral entire nail hallux through fifth toenails Ulcer Exam: There is no evidence of ulcer or pre-ulcerative changes or infection. Orthopedic Exam: Muscle tone and strength are WNL. No limitations in general ROM. No crepitus or effusions noted. Foot type and digits show no abnormalities. Bony prominences are unremarkable. Skin: No Porokeratosis. No infection or ulcers  Diagnosis:  Onychomycosis, , Pain in right toe, pain in left toes  Treatment & Plan Procedures and Treatment: Consent by patient was obtained for treatment procedures. The patient understood the discussion of treatment and procedures well. All questions were answered thoroughly reviewed. Debridement of mycotic and hypertrophic toenails, 1 through 5 bilateral and clearing of subungual debris. No ulceration, no infection noted.  Return Visit-Office Procedure: Patient instructed to return to the office for a follow up visit 10 weeks  for continued evaluation and treatment.    Nicolasa Milbrath DPM 

## 2018-07-04 ENCOUNTER — Telehealth: Payer: Self-pay | Admitting: Family Medicine

## 2018-07-04 DIAGNOSIS — Z86010 Personal history of colonic polyps: Secondary | ICD-10-CM

## 2018-07-04 NOTE — Telephone Encounter (Signed)
Best number [phone]  Pt called wanting to get a referral to GI.  He stated he spoke with you at his [facility]px about this.  He didn't want to go into detail with what is going on with him.  He would like to go to [facility].

## 2018-07-04 NOTE — Telephone Encounter (Signed)
Ordered referral.   H/o polyps, had sen Dr. Jaquita Folds.   Thanks.

## 2018-07-05 NOTE — Telephone Encounter (Signed)
GI referral sent to [facility].  They will call pt to schedule.  Left detailed msg to pt regarding referral

## 2018-09-07 ENCOUNTER — Encounter: Payer: Self-pay | Admitting: Podiatry

## 2018-09-07 ENCOUNTER — Other Ambulatory Visit: Payer: Self-pay

## 2018-09-07 ENCOUNTER — Ambulatory Visit (INDEPENDENT_AMBULATORY_CARE_PROVIDER_SITE_OTHER): Payer: Medicare Other | Admitting: Podiatry

## 2018-09-07 VITALS — Temp 98.7°F

## 2018-09-07 DIAGNOSIS — B351 Tinea unguium: Secondary | ICD-10-CM

## 2018-09-07 DIAGNOSIS — M79676 Pain in unspecified toe(s): Secondary | ICD-10-CM

## 2018-09-07 NOTE — Progress Notes (Signed)
Complaint:  Visit Type: Patient returns to my office for continued preventative foot care services. Complaint: Patient states" my nails have grown long and thick and become painful to walk and wear shoes" . The patient presents for preventative foot care services. No changes to ROS  Podiatric Exam: Vascular: dorsalis pedis and posterior tibial pulses are palpable bilateral. Capillary return is immediate. Temperature gradient is WNL. Skin turgor WNL  Sensorium: Normal Semmes Weinstein monofilament test. Normal tactile sensation bilaterally. Nail Exam: Pt has thick disfigured discolored nails with subungual debris noted bilateral entire nail hallux through fifth toenails Ulcer Exam: There is no evidence of ulcer or pre-ulcerative changes or infection. Orthopedic Exam: Muscle tone and strength are WNL. No limitations in general ROM. No crepitus or effusions noted. Foot type and digits show no abnormalities. Bony prominences are unremarkable. Skin: No Porokeratosis. No infection or ulcers  Diagnosis:  Onychomycosis, , Pain in right toe, pain in left toes  Treatment & Plan Procedures and Treatment: Consent by patient was obtained for treatment procedures. The patient understood the discussion of treatment and procedures well. All questions were answered thoroughly reviewed. Debridement of mycotic and hypertrophic toenails, 1 through 5 bilateral and clearing of subungual debris. No ulceration, no infection noted.  Return Visit-Office Procedure: Patient instructed to return to the office for a follow up visit 10 weeks  for continued evaluation and treatment.    Yilin Weedon DPM 

## 2018-11-16 ENCOUNTER — Ambulatory Visit: Payer: Medicare Other | Admitting: Podiatry

## 2018-11-16 ENCOUNTER — Other Ambulatory Visit: Payer: Self-pay

## 2018-11-16 ENCOUNTER — Encounter: Payer: Self-pay | Admitting: Podiatry

## 2018-11-16 DIAGNOSIS — B351 Tinea unguium: Secondary | ICD-10-CM | POA: Diagnosis not present

## 2018-11-16 DIAGNOSIS — M79609 Pain in unspecified limb: Secondary | ICD-10-CM

## 2018-11-16 DIAGNOSIS — M79676 Pain in unspecified toe(s): Secondary | ICD-10-CM

## 2018-11-16 NOTE — Progress Notes (Signed)
Complaint:  Visit Type: Patient returns to my office for continued preventative foot care services. Complaint: Patient states" my nails have grown long and thick and become painful to walk and wear shoes" The patient presents for preventative foot care services. No changes to ROS  Podiatric Exam: Vascular: dorsalis pedis and posterior tibial pulses are palpable bilateral. Capillary return is immediate. Temperature gradient is WNL. Skin turgor WNL  Sensorium: Normal Semmes Weinstein monofilament test. Normal tactile sensation bilaterally. Nail Exam: Pt has thick disfigured discolored nails with subungual debris noted bilateral entire nail hallux through fifth toenails Ulcer Exam: There is no evidence of ulcer or pre-ulcerative changes or infection. Orthopedic Exam: Muscle tone and strength are WNL. No limitations in general ROM. No crepitus or effusions noted. Foot type and digits show no abnormalities. Bony prominences are unremarkable. Skin: No Porokeratosis. No infection or ulcers.  Healing skin lesion right hallux.   Diagnosis:  Onychomycosis, , Pain in right toe, pain in left toes  Treatment & Plan Procedures and Treatment: Consent by patient was obtained for treatment procedures. The patient understood the discussion of treatment and procedures well. All questions were answered thoroughly reviewed. Debridement of mycotic and hypertrophic toenails, 1 through 5 bilateral and clearing of subungual debris. No ulceration, no infection noted. Subungual blister left hallux. Return Visit-Office Procedure: Patient instructed to return to the office for a follow up visit 10 weeks  for continued evaluation and treatment.    Gardiner Barefoot DPM

## 2019-01-25 ENCOUNTER — Encounter: Payer: Self-pay | Admitting: Podiatry

## 2019-01-25 ENCOUNTER — Ambulatory Visit: Payer: Medicare Other | Admitting: Podiatry

## 2019-01-25 ENCOUNTER — Other Ambulatory Visit: Payer: Self-pay

## 2019-01-25 DIAGNOSIS — M79609 Pain in unspecified limb: Secondary | ICD-10-CM

## 2019-01-25 DIAGNOSIS — B351 Tinea unguium: Secondary | ICD-10-CM

## 2019-01-25 DIAGNOSIS — M79676 Pain in unspecified toe(s): Secondary | ICD-10-CM

## 2019-01-25 NOTE — Progress Notes (Signed)
Complaint:  Visit Type: Patient returns to my office for continued preventative foot care services. Complaint: Patient states" my nails have grown long and thick and become painful to walk and wear shoes" The patient presents for preventative foot care services. No changes to ROS  Podiatric Exam: Vascular: dorsalis pedis and posterior tibial pulses are palpable bilateral. Capillary return is immediate. Temperature gradient is WNL. Skin turgor WNL  Sensorium: Normal Semmes Weinstein monofilament test. Normal tactile sensation bilaterally. Nail Exam: Pt has thick disfigured discolored nails with subungual debris noted bilateral entire nail hallux through fifth toenails Ulcer Exam: There is no evidence of ulcer or pre-ulcerative changes or infection. Orthopedic Exam: Muscle tone and strength are WNL. No limitations in general ROM. No crepitus or effusions noted. Foot type and digits show no abnormalities. Bony prominences are unremarkable. Skin:  Porokeratosis sub 5th metabase left foot.. No infection or ulcers.  Healing skin lesion right hallux.   Diagnosis:  Onychomycosis, , Pain in right toe, pain in left toes  Treatment & Plan Procedures and Treatment: Consent by patient was obtained for treatment procedures. The patient understood the discussion of treatment and procedures well. All questions were answered thoroughly reviewed. Debridement of mycotic and hypertrophic toenails, 1 through 5 bilateral and clearing of subungual debris. No ulceration, no infection noted.  Return Visit-Office Procedure: Patient instructed to return to the office for a follow up visit 10 weeks  for continued evaluation and treatment.    Gardiner Barefoot DPM

## 2019-04-12 ENCOUNTER — Ambulatory Visit: Payer: Medicare PPO | Admitting: Podiatry

## 2019-04-12 ENCOUNTER — Encounter: Payer: Self-pay | Admitting: Podiatry

## 2019-04-12 ENCOUNTER — Other Ambulatory Visit: Payer: Self-pay

## 2019-04-12 DIAGNOSIS — M79676 Pain in unspecified toe(s): Secondary | ICD-10-CM | POA: Diagnosis not present

## 2019-04-12 DIAGNOSIS — B351 Tinea unguium: Secondary | ICD-10-CM | POA: Diagnosis not present

## 2019-04-12 NOTE — Progress Notes (Signed)
Complaint:  Visit Type: Patient returns to my office for continued preventative foot care services. Complaint: Patient states" my nails have grown long and thick and become painful to walk and wear shoes" The patient presents for preventative foot care services. No changes to ROS  Podiatric Exam: Vascular: dorsalis pedis and posterior tibial pulses are palpable bilateral. Capillary return is immediate. Temperature gradient is WNL. Skin turgor WNL  Sensorium: Normal Semmes Weinstein monofilament test. Normal tactile sensation bilaterally. Nail Exam: Pt has thick disfigured discolored nails with subungual debris noted bilateral entire nail hallux through fifth toenails Ulcer Exam: There is no evidence of ulcer or pre-ulcerative changes or infection. Orthopedic Exam: Muscle tone and strength are WNL. No limitations in general ROM. No crepitus or effusions noted. Foot type and digits show no abnormalities. Bony prominences are unremarkable. Skin:  Porokeratosis sub 1 left foot asymptomatic.Marland Kitchen No infection or ulcers.    Diagnosis:  Onychomycosis, , Pain in right toe, pain in left toes  Treatment & Plan Procedures and Treatment: Consent by patient was obtained for treatment procedures. The patient understood the discussion of treatment and procedures well. All questions were answered thoroughly reviewed. Debridement of mycotic and hypertrophic toenails, 1 through 5 bilateral and clearing of subungual debris. No ulceration, no infection noted.  Return Visit-Office Procedure: Patient instructed to return to the office for a follow up visit 10 weeks  for continued evaluation and treatment.    Gardiner Barefoot DPM

## 2019-04-16 DIAGNOSIS — Z0000 Encounter for general adult medical examination without abnormal findings: Secondary | ICD-10-CM | POA: Diagnosis not present

## 2019-06-28 ENCOUNTER — Other Ambulatory Visit: Payer: Self-pay

## 2019-06-28 ENCOUNTER — Ambulatory Visit: Payer: Medicare PPO | Admitting: Podiatry

## 2019-06-28 ENCOUNTER — Encounter: Payer: Self-pay | Admitting: Podiatry

## 2019-06-28 DIAGNOSIS — M79676 Pain in unspecified toe(s): Secondary | ICD-10-CM | POA: Diagnosis not present

## 2019-06-28 DIAGNOSIS — B351 Tinea unguium: Secondary | ICD-10-CM

## 2019-06-28 NOTE — Progress Notes (Signed)
This patient returns to the office for evaluation and treatment of long thick painful nails .  This patient is unable to trim his own nails since the patient cannot reach the feet.  Patient says the nails are painful walking and wearing his shoes.  He returns for preventive foot care services.  General Appearance  Alert, conversant and in no acute stress.  Vascular  Dorsalis pedis and posterior tibial  pulses are palpable  bilaterally.  Capillary return is within normal limits  bilaterally. Temperature is within normal limits  bilaterally.  Neurologic  Senn-Weinstein monofilament wire test within normal limits  bilaterally. Muscle power within normal limits bilaterally.  Nails Thick disfigured discolored nails with subungual debris  from hallux to fifth toes bilaterally. No evidence of bacterial infection or drainage bilaterally.  Orthopedic  No limitations of motion  feet .  No crepitus or effusions noted.  No bony pathology or digital deformities noted.  Skin  normotropic skin with no porokeratosis noted bilaterally.  No signs of infections or ulcers noted.     Onychomycosis  Pain in toes right foot  Pain in toes left foot  Debridement  of nails  1-5  B/L with a nail nipper.  Nails were then filed using a dremel tool with no incidents.    RTC 10 weeks   Tommey Barret DPM  

## 2019-09-06 ENCOUNTER — Ambulatory Visit: Payer: Medicare PPO | Admitting: Podiatry

## 2019-09-06 ENCOUNTER — Other Ambulatory Visit: Payer: Self-pay

## 2019-09-06 ENCOUNTER — Encounter: Payer: Self-pay | Admitting: Podiatry

## 2019-09-06 VITALS — Temp 96.8°F

## 2019-09-06 DIAGNOSIS — M79676 Pain in unspecified toe(s): Secondary | ICD-10-CM

## 2019-09-06 DIAGNOSIS — B351 Tinea unguium: Secondary | ICD-10-CM | POA: Diagnosis not present

## 2019-09-06 NOTE — Progress Notes (Signed)
This patient returns to the office for evaluation and treatment of long thick painful nails .  This patient is unable to trim his own nails since the patient cannot reach his feet.  Patient says the nails are painful walking and wearing his shoes.  He returns for preventive foot care services.  General Appearance  Alert, conversant and in no acute stress.  Vascular  Dorsalis pedis and posterior tibial  pulses are palpable  bilaterally.  Capillary return is within normal limits  bilaterally. Temperature is within normal limits  bilaterally.  Neurologic  Senn-Weinstein monofilament wire test within normal limits  bilaterally. Muscle power within normal limits bilaterally.  Nails Thick disfigured discolored nails with subungual debris  from hallux to fifth toes bilaterally. No evidence of bacterial infection or drainage bilaterally.  Orthopedic  No limitations of motion  feet .  No crepitus or effusions noted.  No bony pathology or digital deformities noted.  Skin  normotropic skin with no porokeratosis noted bilaterally.  No signs of infections or ulcers noted.     Onychomycosis  Pain in toes right foot  Pain in toes left foot  Debridement  of nails  1-5  B/L with a nail nipper.  Nails were then filed using a dremel tool with no incidents.    RTC  10 weeks    Deondrae Mcgrail DPM  

## 2019-11-15 ENCOUNTER — Other Ambulatory Visit: Payer: Self-pay

## 2019-11-15 ENCOUNTER — Encounter: Payer: Self-pay | Admitting: Podiatry

## 2019-11-15 ENCOUNTER — Ambulatory Visit: Payer: Medicare PPO | Admitting: Podiatry

## 2019-11-15 DIAGNOSIS — M79676 Pain in unspecified toe(s): Secondary | ICD-10-CM | POA: Diagnosis not present

## 2019-11-15 DIAGNOSIS — B351 Tinea unguium: Secondary | ICD-10-CM | POA: Diagnosis not present

## 2019-11-15 NOTE — Progress Notes (Signed)
This patient returns to the office for evaluation and treatment of long thick painful nails .  This patient is unable to trim his own nails since the patient cannot reach his feet.  Patient says the nails are painful walking and wearing his shoes.  He returns for preventive foot care services.  General Appearance  Alert, conversant and in no acute stress.  Vascular  Dorsalis pedis and posterior tibial  pulses are palpable  bilaterally.  Capillary return is within normal limits  bilaterally. Temperature is within normal limits  bilaterally.  Neurologic  Senn-Weinstein monofilament wire test within normal limits  bilaterally. Muscle power within normal limits bilaterally.  Nails Thick disfigured discolored nails with subungual debris  from hallux to fifth toes bilaterally. No evidence of bacterial infection or drainage bilaterally.  Orthopedic  No limitations of motion  feet .  No crepitus or effusions noted.  No bony pathology or digital deformities noted.  Skin  normotropic skin with no porokeratosis noted bilaterally.  No signs of infections or ulcers noted.     Onychomycosis  Pain in toes right foot  Pain in toes left foot  Debridement  of nails  1-5  B/L with a nail nipper.  Nails were then filed using a dremel tool with no incidents.    RTC  10 weeks    Urvi Imes DPM  

## 2020-01-22 ENCOUNTER — Ambulatory Visit: Payer: Medicare Other | Admitting: Dermatology

## 2020-01-22 ENCOUNTER — Other Ambulatory Visit: Payer: Self-pay

## 2020-01-22 ENCOUNTER — Encounter: Payer: Self-pay | Admitting: Dermatology

## 2020-01-22 DIAGNOSIS — D229 Melanocytic nevi, unspecified: Secondary | ICD-10-CM | POA: Diagnosis not present

## 2020-01-22 DIAGNOSIS — L821 Other seborrheic keratosis: Secondary | ICD-10-CM

## 2020-01-22 DIAGNOSIS — D1800 Hemangioma unspecified site: Secondary | ICD-10-CM

## 2020-01-22 DIAGNOSIS — Z1283 Encounter for screening for malignant neoplasm of skin: Secondary | ICD-10-CM

## 2020-01-22 DIAGNOSIS — L578 Other skin changes due to chronic exposure to nonionizing radiation: Secondary | ICD-10-CM

## 2020-01-22 DIAGNOSIS — L720 Epidermal cyst: Secondary | ICD-10-CM

## 2020-01-22 DIAGNOSIS — D225 Melanocytic nevi of trunk: Secondary | ICD-10-CM | POA: Diagnosis not present

## 2020-01-22 DIAGNOSIS — L814 Other melanin hyperpigmentation: Secondary | ICD-10-CM

## 2020-01-22 NOTE — Progress Notes (Signed)
   Follow-Up Visit   Subjective  Victor Smith is a 79 y.o. male who presents for the following: TBSE (area of concern on scalp).  Patient here for upper body skin exam and skin cancer screening. Patient has an area of concern on his scalp- scaly bump. Patient has h/o skin cancer behind left ear when he lived in [place].  The following portions of the chart were reviewed this encounter and updated as appropriate:      Review of Systems:  No other skin or systemic complaints except as noted in HPI or Assessment and Plan.  Objective  Well appearing patient in no apparent distress; mood and affect are within normal limits.  A full examination was performed including scalp, head, eyes, ears, nose, lips, neck, chest, axillae, abdomen, back, buttocks, bilateral upper extremities, bilateral lower extremities, hands, feet, fingers, toes, fingernails, and toenails. All findings within normal limits unless otherwise noted below.  Objective  Right Forearm - Anterior, Right Lower Back, Sternal Notch: 1.5 cm Subcutaneous nodule within scar. Right lower back  1 cm Subcutaneous nodule Sternal notch  1 cm Subcutaneous nodule Right forearm below elbow  Objective  Left Scapula, Right Breast: 8 x 4 mm speckled brown macule L. Scapula  3.5 mm brown macule R. breast   Assessment & Plan  Epidermal cyst (3) Sternal Notch; Right Forearm - Anterior; Right Lower Back  Reassured benign growth.  Recommend observation.  Discussed surgical excision in office if changes noted or symptomatic.   Nevus (2) Right Breast; Left Scapula  Benign-appearing.  Stable. Observation.  Call clinic for new or changing lesions.  Recommend daily use of broad spectrum spf 30+ sunscreen to sun-exposed areas.     Lentigines - Scattered tan macules - Discussed due to sun exposure - Benign, observe - Call for any changes  Seborrheic Keratoses - Stuck-on, waxy, tan-brown papules, including L crown scalp  -  Discussed benign etiology and prognosis. - Observe - Call for any changes  Melanocytic Nevi - Tan-brown and/or pink-flesh-colored symmetric macules and papules - Benign appearing on exam today - Observation - Call clinic for new or changing moles - Recommend daily use of broad spectrum spf 30+ sunscreen to sun-exposed areas.   Hemangiomas - Red papules - Discussed benign nature - Observe - Call for any changes  Actinic Damage - diffuse scaly erythematous macules with underlying dyspigmentation - Recommend daily broad spectrum sunscreen SPF 30+ to sun-exposed areas, reapply every 2 hours as needed.  - Call for new or changing lesions.  History of Skin Cancer   Clear. Observe for recurrence. L post ear Call clinic for new or changing lesions.   Recommend regular skin exams, daily broad-spectrum spf 30+ sunscreen use, and photoprotection.     Skin cancer screening performed today.   Return in about 1 year (around 01/21/2021) for TBSE.   Documentation: I have reviewed the above documentation for accuracy and completeness, and I agree with the above.  Brendolyn Patty MD  I, Donzetta Kohut, CMA, am acting as scribe for Brendolyn Patty, MD .

## 2020-01-22 NOTE — Patient Instructions (Signed)
Recommend daily broad spectrum sunscreen SPF 30+ to sun-exposed areas, reapply every 2 hours as needed. Call for new or changing lesions.  

## 2020-01-24 ENCOUNTER — Encounter: Payer: Self-pay | Admitting: Podiatry

## 2020-01-24 ENCOUNTER — Other Ambulatory Visit: Payer: Self-pay

## 2020-01-24 ENCOUNTER — Ambulatory Visit: Payer: Medicare PPO | Admitting: Podiatry

## 2020-01-24 DIAGNOSIS — M79609 Pain in unspecified limb: Secondary | ICD-10-CM

## 2020-01-24 DIAGNOSIS — B351 Tinea unguium: Secondary | ICD-10-CM | POA: Diagnosis not present

## 2020-01-24 NOTE — Progress Notes (Signed)
This patient returns to the office for evaluation and treatment of long thick painful nails .  This patient is unable to trim his own nails since the patient cannot reach his feet.  Patient says the nails are painful walking and wearing his shoes.  He returns for preventive foot care services.  General Appearance  Alert, conversant and in no acute stress.  Vascular  Dorsalis pedis and posterior tibial  pulses are palpable  bilaterally.  Capillary return is within normal limits  bilaterally. Temperature is within normal limits  bilaterally.  Neurologic  Senn-Weinstein monofilament wire test within normal limits  bilaterally. Muscle power within normal limits bilaterally.  Nails Thick disfigured discolored nails with subungual debris  from hallux to fifth toes bilaterally. No evidence of bacterial infection or drainage bilaterally.  Orthopedic  No limitations of motion  feet .  No crepitus or effusions noted.  No bony pathology or digital deformities noted.  Skin  normotropic skin with no porokeratosis noted bilaterally.  No signs of infections or ulcers noted.     Onychomycosis  Pain in toes right foot  Pain in toes left foot  Debridement  of nails  1-5  B/L with a nail nipper.  Nails were then filed using a dremel tool with no incidents.    RTC  10 weeks    Jonathyn Carothers DPM  

## 2020-04-03 ENCOUNTER — Encounter: Payer: Self-pay | Admitting: Podiatry

## 2020-04-03 ENCOUNTER — Other Ambulatory Visit: Payer: Self-pay

## 2020-04-03 ENCOUNTER — Ambulatory Visit: Payer: Medicare PPO | Admitting: Podiatry

## 2020-04-03 DIAGNOSIS — M79676 Pain in unspecified toe(s): Secondary | ICD-10-CM

## 2020-04-03 DIAGNOSIS — B351 Tinea unguium: Secondary | ICD-10-CM | POA: Diagnosis not present

## 2020-04-03 NOTE — Progress Notes (Signed)
This patient returns to the office for evaluation and treatment of long thick painful nails .  This patient is unable to trim his own nails since the patient cannot reach his feet.  Patient says the nails are painful walking and wearing his shoes.  He returns for preventive foot care services.  General Appearance  Alert, conversant and in no acute stress.  Vascular  Dorsalis pedis and posterior tibial  pulses are palpable  bilaterally.  Capillary return is within normal limits  bilaterally. Temperature is within normal limits  bilaterally.  Neurologic  Senn-Weinstein monofilament wire test within normal limits  bilaterally. Muscle power within normal limits bilaterally.  Nails Thick disfigured discolored nails with subungual debris  from hallux to fifth toes bilaterally. No evidence of bacterial infection or drainage bilaterally.  Orthopedic  No limitations of motion  feet .  No crepitus or effusions noted.  No bony pathology or digital deformities noted.  Skin  normotropic skin with no porokeratosis noted bilaterally.  No signs of infections or ulcers noted.     Onychomycosis  Pain in toes right foot  Pain in toes left foot  Debridement  of nails  1-5  B/L with a nail nipper.  Nails were then filed using a dremel tool with no incidents.    RTC  10 weeks    Sahas Sluka DPM  

## 2020-06-12 ENCOUNTER — Other Ambulatory Visit: Payer: Self-pay

## 2020-06-12 ENCOUNTER — Encounter: Payer: Self-pay | Admitting: Podiatry

## 2020-06-12 ENCOUNTER — Ambulatory Visit: Payer: Medicare PPO | Admitting: Podiatry

## 2020-06-12 DIAGNOSIS — B351 Tinea unguium: Secondary | ICD-10-CM | POA: Diagnosis not present

## 2020-06-12 DIAGNOSIS — M79609 Pain in unspecified limb: Secondary | ICD-10-CM

## 2020-06-12 NOTE — Progress Notes (Signed)
This patient returns to the office for evaluation and treatment of long thick painful nails .  This patient is unable to trim his own nails since the patient cannot reach his feet.  Patient says the nails are painful walking and wearing his shoes.  He returns for preventive foot care services.  General Appearance  Alert, conversant and in no acute stress.  Vascular  Dorsalis pedis and posterior tibial  pulses are palpable  bilaterally.  Capillary return is within normal limits  bilaterally. Temperature is within normal limits  bilaterally.  Neurologic  Senn-Weinstein monofilament wire test within normal limits  bilaterally. Muscle power within normal limits bilaterally.  Nails Thick disfigured discolored nails with subungual debris  from hallux to fifth toes bilaterally. No evidence of bacterial infection or drainage bilaterally.  Orthopedic  No limitations of motion  feet .  No crepitus or effusions noted.  No bony pathology or digital deformities noted.  Skin  normotropic skin with no porokeratosis noted bilaterally.  No signs of infections or ulcers noted.     Onychomycosis  Pain in toes right foot  Pain in toes left foot  Debridement  of nails  1-5  B/L with a nail nipper.  Nails were then filed using a dremel tool with no incidents.    RTC  10 weeks    Dasja Brase DPM  

## 2020-08-21 ENCOUNTER — Encounter: Payer: Self-pay | Admitting: Podiatry

## 2020-08-21 ENCOUNTER — Other Ambulatory Visit: Payer: Self-pay

## 2020-08-21 ENCOUNTER — Ambulatory Visit (INDEPENDENT_AMBULATORY_CARE_PROVIDER_SITE_OTHER): Payer: Medicare PPO | Admitting: Podiatry

## 2020-08-21 DIAGNOSIS — M79676 Pain in unspecified toe(s): Secondary | ICD-10-CM | POA: Diagnosis not present

## 2020-08-21 DIAGNOSIS — M79609 Pain in unspecified limb: Secondary | ICD-10-CM

## 2020-08-21 DIAGNOSIS — B351 Tinea unguium: Secondary | ICD-10-CM

## 2020-08-21 NOTE — Progress Notes (Signed)
This patient returns to the office for evaluation and treatment of long thick painful nails .  This patient is unable to trim his own nails since the patient cannot reach his feet.  Patient says the nails are painful walking and wearing his shoes.  He returns for preventive foot care services.  General Appearance  Alert, conversant and in no acute stress.  Vascular  Dorsalis pedis and posterior tibial  pulses are palpable  bilaterally.  Capillary return is within normal limits  bilaterally. Temperature is within normal limits  bilaterally.  Neurologic  Senn-Weinstein monofilament wire test within normal limits  bilaterally. Muscle power within normal limits bilaterally.  Nails Thick disfigured discolored nails with subungual debris  from hallux to fifth toes bilaterally. No evidence of bacterial infection or drainage bilaterally.  Orthopedic  No limitations of motion  feet .  No crepitus or effusions noted.  No bony pathology or digital deformities noted.  Skin  normotropic skin with no porokeratosis noted bilaterally.  No signs of infections or ulcers noted.     Onychomycosis  Pain in toes right foot  Pain in toes left foot  Debridement  of nails  1-5  B/L with a nail nipper.  Nails were then filed using a dremel tool with no incidents.    RTC  10 weeks    Joyclyn Plazola DPM  

## 2020-11-06 ENCOUNTER — Other Ambulatory Visit: Payer: Self-pay

## 2020-11-06 ENCOUNTER — Encounter: Payer: Self-pay | Admitting: Podiatry

## 2020-11-06 ENCOUNTER — Ambulatory Visit: Payer: Medicare PPO | Admitting: Podiatry

## 2020-11-06 DIAGNOSIS — M79609 Pain in unspecified limb: Secondary | ICD-10-CM | POA: Diagnosis not present

## 2020-11-06 DIAGNOSIS — B351 Tinea unguium: Secondary | ICD-10-CM

## 2020-11-06 NOTE — Progress Notes (Signed)
This patient returns to the office for evaluation and treatment of long thick painful nails .  This patient is unable to trim his own nails since the patient cannot reach his feet.  Patient says the nails are painful walking and wearing his shoes.  He returns for preventive foot care services.  General Appearance  Alert, conversant and in no acute stress.  Vascular  Dorsalis pedis and posterior tibial  pulses are palpable  bilaterally.  Capillary return is within normal limits  bilaterally. Temperature is within normal limits  bilaterally.  Neurologic  Senn-Weinstein monofilament wire test within normal limits  bilaterally. Muscle power within normal limits bilaterally.  Nails Thick disfigured discolored nails with subungual debris  from hallux to fifth toes bilaterally. No evidence of bacterial infection or drainage bilaterally.  Orthopedic  No limitations of motion  feet .  No crepitus or effusions noted.  No bony pathology or digital deformities noted.  Skin  normotropic skin with no porokeratosis noted bilaterally.  No signs of infections or ulcers noted.     Onychomycosis  Pain in toes right foot  Pain in toes left foot  Debridement  of nails  1-5  B/L with a nail nipper.  Nails were then filed using a dremel tool with no incidents.    RTC prn   Gardiner Barefoot DPM

## 2021-01-27 ENCOUNTER — Encounter: Payer: Medicare PPO | Admitting: Dermatology

## 2021-09-24 ENCOUNTER — Ambulatory Visit: Payer: Medicare PPO | Admitting: Podiatry

## 2021-09-24 ENCOUNTER — Encounter: Payer: Self-pay | Admitting: Podiatry

## 2021-09-24 DIAGNOSIS — B351 Tinea unguium: Secondary | ICD-10-CM | POA: Diagnosis not present

## 2021-09-24 DIAGNOSIS — M79609 Pain in unspecified limb: Secondary | ICD-10-CM | POA: Diagnosis not present

## 2021-09-24 NOTE — Progress Notes (Signed)
This patient returns to the office for evaluation and treatment of long thick painful nails .  This patient is unable to trim his own nails since the patient cannot reach his feet.  Patient says the nails are painful walking and wearing his shoes.  He returns for preventive foot care services.  General Appearance  Alert, conversant and in no acute stress.  Vascular  Dorsalis pedis and posterior tibial  pulses are palpable  bilaterally.  Capillary return is within normal limits  bilaterally. Temperature is within normal limits  bilaterally.  Neurologic  Senn-Weinstein monofilament wire test within normal limits  bilaterally. Muscle power within normal limits bilaterally.  Nails Thick disfigured discolored nails with subungual debris  from hallux to fifth toes bilaterally. No evidence of bacterial infection or drainage bilaterally.  Orthopedic  No limitations of motion  feet .  No crepitus or effusions noted.  No bony pathology or digital deformities noted.  Skin  normotropic skin with no porokeratosis noted bilaterally.  No signs of infections or ulcers noted.     Onychomycosis  Pain in toes right foot  Pain in toes left foot  Debridement  of nails  1-5  B/L with a nail nipper.  Nails were then filed using a dremel tool with no incidents.    RTC 6 months   Gardiner Barefoot DPM

## 2021-11-03 ENCOUNTER — Ambulatory Visit: Payer: Medicare PPO | Admitting: Dermatology

## 2021-11-03 ENCOUNTER — Encounter: Payer: Self-pay | Admitting: Dermatology

## 2021-11-03 DIAGNOSIS — L821 Other seborrheic keratosis: Secondary | ICD-10-CM

## 2021-11-03 DIAGNOSIS — L300 Nummular dermatitis: Secondary | ICD-10-CM

## 2021-11-03 DIAGNOSIS — D1800 Hemangioma unspecified site: Secondary | ICD-10-CM

## 2021-11-03 DIAGNOSIS — L820 Inflamed seborrheic keratosis: Secondary | ICD-10-CM

## 2021-11-03 DIAGNOSIS — Z1283 Encounter for screening for malignant neoplasm of skin: Secondary | ICD-10-CM | POA: Diagnosis not present

## 2021-11-03 DIAGNOSIS — D225 Melanocytic nevi of trunk: Secondary | ICD-10-CM | POA: Diagnosis not present

## 2021-11-03 DIAGNOSIS — L814 Other melanin hyperpigmentation: Secondary | ICD-10-CM

## 2021-11-03 DIAGNOSIS — D229 Melanocytic nevi, unspecified: Secondary | ICD-10-CM

## 2021-11-03 DIAGNOSIS — L578 Other skin changes due to chronic exposure to nonionizing radiation: Secondary | ICD-10-CM

## 2021-11-03 DIAGNOSIS — L720 Epidermal cyst: Secondary | ICD-10-CM

## 2021-11-03 NOTE — Progress Notes (Signed)
Follow-Up Visit   Subjective  Victor Smith is a 81 y.o. male who presents for the following: Annual Exam (Here for skin cancer screening. Upper body. No personal Hx of skin cancer or dysplastic nevi). No concerns.  The patient presents for Upper Body Skin Exam (UBSE) for skin cancer screening and mole check.  The patient has spots, moles and lesions to be evaluated, some may be new or changing and the patient has concerns that these could be cancer.   The following portions of the chart were reviewed this encounter and updated as appropriate:      Review of Systems: No other skin or systemic complaints except as noted in HPI or Assessment and Plan.   Objective  Well appearing patient in no apparent distress; mood and affect are within normal limits.  A focused examination was performed including all skin waist up and legs. Relevant physical exam findings are noted in the Assessment and Plan.  Left Forearm - Posterior Pink scaly patch  left scapula, right chest 8 x 4 mm speckled brown macule L scapula   3.5 mm brown macule with notch R. chest  Right Postauricular Area Erythematous keratotic or waxy stuck-on papule  Right Occipital hair line, sternal notch, right lower back 1.5 cm Subcutaneous nodule within scar. Right lower back   1 cm Subcutaneous nodule Sternal notch  1 cm subcutaneous nodule at right occipital hair line   Assessment & Plan   Lentigines - Scattered tan macules - Due to sun exposure - Benign-appearing, observe - Recommend daily broad spectrum sunscreen SPF 30+ to sun-exposed areas, reapply every 2 hours as needed. - Call for any changes  Seborrheic Keratoses - Stuck-on, waxy, tan-brown papules and/or plaques  - Benign-appearing - Discussed benign etiology and prognosis. - Observe - Call for any changes  Melanocytic Nevi - Tan-brown and/or pink-flesh-colored symmetric macules and papules - Benign appearing on exam today - Observation -  Call clinic for new or changing moles - Recommend daily use of broad spectrum spf 30+ sunscreen to sun-exposed areas.   Hemangiomas - Red papules - Discussed benign nature - Observe - Call for any changes  Actinic Damage - Chronic condition, secondary to cumulative UV/sun exposure - diffuse scaly erythematous macules with underlying dyspigmentation - Recommend daily broad spectrum sunscreen SPF 30+ to sun-exposed areas, reapply every 2 hours as needed.  - Staying in the shade or wearing long sleeves, sun glasses (UVA+UVB protection) and wide brim hats (4-inch brim around the entire circumference of the hat) are also recommended for sun protection.  - Call for new or changing lesions.  Skin cancer screening performed today.  Nummular dermatitis Left Forearm - Posterior  Patient defers Rx treatment today. Okay to continue OTC 1% cortisone cream.  Recommend mild soap and moisturizing cream 1-2 times daily.  Gentle skin care handout provided.    Nevus left scapula, right chest  Benign-appearing.  Stable. Observation.  Call clinic for new or changing lesions.  Recommend daily use of broad spectrum spf 30+ sunscreen to sun-exposed areas.    Inflamed seborrheic keratosis Right Postauricular Area  Patient defers treatment today, not bothersome  Epidermal inclusion cyst Right Occipital hair line, sternal notch, right lower back  Benign-appearing. Exam most consistent with an epidermal inclusion cyst. Discussed that a cyst is a benign growth that can grow over time and sometimes get irritated or inflamed. Recommend observation if it is not bothersome. Discussed option of surgical excision to remove it if it is growing,  symptomatic, or other changes noted. Please call for new or changing lesions so they can be evaluated.     Return in about 1 year (around 11/04/2022) for UBSE.  I, Emelia Salisbury, CMA, am acting as scribe for Brendolyn Patty, MD.  Documentation: I have reviewed the above  documentation for accuracy and completeness, and I agree with the above.  Brendolyn Patty MD

## 2021-11-03 NOTE — Patient Instructions (Addendum)
Recommend daily broad spectrum sunscreen SPF 30+ to sun-exposed areas, reapply every 2 hours as needed. Call for new or changing lesions.  Staying in the shade or wearing long sleeves, sun glasses (UVA+UVB protection) and wide brim hats (4-inch brim around the entire circumference of the hat) are also recommended for sun protection.    Seborrheic Keratosis  What causes seborrheic keratoses? Seborrheic keratoses are harmless, common skin growths that first appear during adult life.  As time goes by, more growths appear.  Some people may develop a large number of them.  Seborrheic keratoses appear on both covered and uncovered body parts.  They are not caused by sunlight.  The tendency to develop seborrheic keratoses can be inherited.  They vary in color from skin-colored to gray, brown, or even black.  They can be either smooth or have a rough, warty surface.   Seborrheic keratoses are superficial and look as if they were stuck on the skin.  Under the microscope this type of keratosis looks like layers upon layers of skin.  That is why at times the top layer may seem to fall off, but the rest of the growth remains and re-grows.    Treatment Seborrheic keratoses do not need to be treated, but can easily be removed in the office.  Seborrheic keratoses often cause symptoms when they rub on clothing or jewelry.  Lesions can be in the way of shaving.  If they become inflamed, they can cause itching, soreness, or burning.  Removal of a seborrheic keratosis can be accomplished by freezing, burning, or surgery. If any spot bleeds, scabs, or grows rapidly, please return to have it checked, as these can be an indication of a skin cancer.  Melanoma ABCDEs  Melanoma is the most dangerous type of skin cancer, and is the leading cause of death from skin disease.  You are more likely to develop melanoma if you: Have light-colored skin, light-colored eyes, or red or blond hair Spend a lot of time in the sun Tan  regularly, either outdoors or in a tanning bed Have had blistering sunburns, especially during childhood Have a close family member who has had a melanoma Have atypical moles or large birthmarks  Early detection of melanoma is key since treatment is typically straightforward and cure rates are extremely high if we catch it early.   The first sign of melanoma is often a change in a mole or a new dark spot.  The ABCDE system is a way of remembering the signs of melanoma.  A for asymmetry:  The two halves do not match. B for border:  The edges of the growth are irregular. C for color:  A mixture of colors are present instead of an even brown color. D for diameter:  Melanomas are usually (but not always) greater than 6mm - the size of a pencil eraser. E for evolution:  The spot keeps changing in size, shape, and color.  Please check your skin once per month between visits. You can use a small mirror in front and a large mirror behind you to keep an eye on the back side or your body.   If you see any new or changing lesions before your next follow-up, please call to schedule a visit.  Please continue daily skin protection including broad spectrum sunscreen SPF 30+ to sun-exposed areas, reapplying every 2 hours as needed when you're outdoors.   Staying in the shade or wearing long sleeves, sun glasses (UVA+UVB protection) and wide brim   hats (4-inch brim around the entire circumference of the hat) are also recommended for sun protection.     Due to recent changes in healthcare laws, you may see results of your pathology and/or laboratory studies on MyChart before the doctors have had a chance to review them. We understand that in some cases there may be results that are confusing or concerning to you. Please understand that not all results are received at the same time and often the doctors may need to interpret multiple results in order to provide you with the best plan of care or course of  treatment. Therefore, we ask that you please give us 2 business days to thoroughly review all your results before contacting the office for clarification. Should we see a critical lab result, you will be contacted sooner.   If You Need Anything After Your Visit  If you have any questions or concerns for your doctor, please call our main line at [phone] and press option 4 to reach your doctor's medical assistant. If no one answers, please leave a voicemail as directed and we will return your call as soon as possible. Messages left after 4 pm will be answered the following business day.   You may also send us a message via MyChart. We typically respond to MyChart messages within 1-2 business days.  For prescription refills, please ask your pharmacy to contact our office. Our fax number is [phone].  If you have an urgent issue when the clinic is closed that cannot wait until the next business day, you can page your doctor at the number below.    Please note that while we do our best to be available for urgent issues outside of office hours, we are not available 24/7.   If you have an urgent issue and are unable to reach us, you may choose to seek medical care at your doctor's office, retail clinic, urgent care center, or emergency room.  If you have a medical emergency, please immediately call 911 or go to the emergency department.  Pager Numbers  - Dr. Kowalski: [phone]  - Dr. Moye: [phone]  - Dr. Stewart: [phone]  In the event of inclement weather, please call our main line at [phone] for an update on the status of any delays or closures.  Dermatology Medication Tips: Please keep the boxes that topical medications come in in order to help keep track of the instructions about where and how to use these. Pharmacies typically print the medication instructions only on the boxes and not directly on the medication tubes.   If your medication is too expensive,  please contact our office at [phone] option 4 or send us a message through MyChart.   We are unable to tell what your co-pay for medications will be in advance as this is different depending on your insurance coverage. However, we may be able to find a substitute medication at lower cost or fill out paperwork to get insurance to cover a needed medication.   If a prior authorization is required to get your medication covered by your insurance company, please allow us 1-2 business days to complete this process.  Drug prices often vary depending on where the prescription is filled and some pharmacies may offer cheaper prices.  The website www.goodrx.com contains coupons for medications through different pharmacies. The prices here do not account for what the cost may be with help from insurance (it may be cheaper with your insurance), but the website can give you   the price if you did not use any insurance.  - You can print the associated coupon and take it with your prescription to the pharmacy.  - You may also stop by our office during regular business hours and pick up a GoodRx coupon card.  - If you need your prescription sent electronically to a different pharmacy, notify our office through [facility] MyChart or by phone at [phone] option 4.     Si Usted Necesita Algo Despus de Su Visita  Tambin puede enviarnos un mensaje a travs de MyChart. Por lo general respondemos a los mensajes de MyChart en el transcurso de 1 a 2 das hbiles.  Para renovar recetas, por favor pida a su farmacia que se ponga en contacto con nuestra oficina. Nuestro nmero de fax es el [phone].  Si tiene un asunto urgente cuando la clnica est cerrada y que no puede esperar hasta el siguiente da hbil, puede llamar/localizar a su doctor(a) al nmero que aparece a continuacin.   Por favor, tenga en cuenta que aunque hacemos todo lo posible para estar disponibles para asuntos urgentes fuera del  horario de oficina, no estamos disponibles las 24 horas del da, los 7 das de la semana.   Si tiene un problema urgente y no puede comunicarse con nosotros, puede optar por buscar atencin mdica  en el consultorio de su doctor(a), en una clnica privada, en un centro de atencin urgente o en una sala de emergencias.  Si tiene una emergencia mdica, por favor llame inmediatamente al 911 o vaya a la sala de emergencias.  Nmeros de bper  - Dr. Kowalski: [phone]  - Dra. Moye: [phone]  - Dra. Stewart: [phone]  En caso de inclemencias del tiempo, por favor llame a nuestra lnea principal al [phone] para una actualizacin sobre el estado de cualquier retraso o cierre.  Consejos para la medicacin en dermatologa: Por favor, guarde las cajas en las que vienen los medicamentos de uso tpico para ayudarle a seguir las instrucciones sobre dnde y cmo usarlos. Las farmacias generalmente imprimen las instrucciones del medicamento slo en las cajas y no directamente en los tubos del medicamento.   Si su medicamento es muy caro, por favor, pngase en contacto con nuestra oficina llamando al [phone] y presione la opcin 4 o envenos un mensaje a travs de MyChart.   No podemos decirle cul ser su copago por los medicamentos por adelantado ya que esto es diferente dependiendo de la cobertura de su seguro. Sin embargo, es posible que podamos encontrar un medicamento sustituto a menor costo o llenar un formulario para que el seguro cubra el medicamento que se considera necesario.   Si se requiere una autorizacin previa para que su compaa de seguros cubra su medicamento, por favor permtanos de 1 a 2 das hbiles para completar este proceso.  Los precios de los medicamentos varan con frecuencia dependiendo del lugar de dnde se surte la receta y alguna farmacias pueden ofrecer precios ms baratos.  El sitio web www.goodrx.com tiene cupones para medicamentos de diferentes  farmacias. Los precios aqu no tienen en cuenta lo que podra costar con la ayuda del seguro (puede ser ms barato con su seguro), pero el sitio web puede darle el precio si no utiliz ningn seguro.  - Puede imprimir el cupn correspondiente y llevarlo con su receta a la farmacia.  - Tambin puede pasar por nuestra oficina durante el horario de atencin regular y recoger una tarjeta de cupones de GoodRx.  - Si necesita   Si necesita que su receta se enve electrnicamente a una farmacia diferente, informe a nuestra oficina a travs de MyChart de [facility] o por telfono llamando al [phone] y presione la opcin 4.  

## 2022-03-25 ENCOUNTER — Ambulatory Visit: Payer: Medicare PPO | Admitting: Podiatry

## 2022-03-25 ENCOUNTER — Encounter: Payer: Self-pay | Admitting: Podiatry

## 2022-03-25 DIAGNOSIS — M79676 Pain in unspecified toe(s): Secondary | ICD-10-CM | POA: Diagnosis not present

## 2022-03-25 DIAGNOSIS — B351 Tinea unguium: Secondary | ICD-10-CM | POA: Diagnosis not present

## 2022-03-25 DIAGNOSIS — L84 Corns and callosities: Secondary | ICD-10-CM

## 2022-03-25 NOTE — Progress Notes (Signed)
This patient returns to the office for evaluation and treatment of long thick painful nails .  This patient is unable to trim his own nails since the patient cannot reach his feet.  Patient says the nails are painful walking and wearing his shoes. He has painful callus under his big toe joint left foot. He returns for preventive foot care services.  General Appearance  Alert, conversant and in no acute stress.  Vascular  Dorsalis pedis and posterior tibial  pulses are palpable  bilaterally.  Capillary return is within normal limits  bilaterally. Temperature is within normal limits  bilaterally.  Neurologic  Senn-Weinstein monofilament wire test within normal limits  bilaterally. Muscle power within normal limits bilaterally.  Nails Thick disfigured discolored nails with subungual debris  from hallux to fifth toes bilaterally. No evidence of bacterial infection or drainage bilaterally.  Orthopedic  No limitations of motion  feet .  No crepitus or effusions noted.  No bony pathology or digital deformities noted.  Skin  normotropic skin with noted bilaterally.  No signs of infections or ulcers noted.   Porokeratosis left foot.  Onychomycosis  Pain in toes right foot  Pain in toes left foot  Debridement  of nails  1-5  B/L with a nail nipper.  Nails were then filed using a dremel tool with no incidents.   Debride callus with # 15 blade with dremel tool usage.   RTC 4 months    Gardiner Barefoot DPM

## 2022-06-10 ENCOUNTER — Ambulatory Visit: Payer: Medicare PPO | Admitting: Podiatry

## 2022-06-10 ENCOUNTER — Encounter: Payer: Self-pay | Admitting: Podiatry

## 2022-06-10 DIAGNOSIS — B351 Tinea unguium: Secondary | ICD-10-CM | POA: Diagnosis not present

## 2022-06-10 DIAGNOSIS — M79609 Pain in unspecified limb: Secondary | ICD-10-CM

## 2022-06-10 DIAGNOSIS — L84 Corns and callosities: Secondary | ICD-10-CM

## 2022-06-10 NOTE — Progress Notes (Signed)
This patient returns to the office for evaluation and treatment of long thick painful nails .  This patient is unable to trim his own nails since the patient cannot reach his feet.  Patient says the nails are painful walking and wearing his shoes. He has painful callus under his big toe joint left foot. He returns for preventive foot care services.  General Appearance  Alert, conversant and in no acute stress.  Vascular  Dorsalis pedis and posterior tibial  pulses are palpable  bilaterally.  Capillary return is within normal limits  bilaterally. Temperature is within normal limits  bilaterally.  Neurologic  Senn-Weinstein monofilament wire test within normal limits  bilaterally. Muscle power within normal limits bilaterally.  Nails Thick disfigured discolored nails with subungual debris  from hallux to fifth toes bilaterally. No evidence of bacterial infection or drainage bilaterally.  Orthopedic  No limitations of motion  feet .  No crepitus or effusions noted.  No bony pathology or digital deformities noted.  Skin  normotropic skin with noted bilaterally.  No signs of infections or ulcers noted.   Porokeratosis left foot.  Onychomycosis  Pain in toes right foot  Pain in toes left foot  Debridement  of nails  1-5  B/L with a nail nipper.  Nails were then filed using a dremel tool with no incidents.   Debride callus with # 15 blade with dremel tool usage.   RTC 3  months    Gardiner Barefoot DPM

## 2022-08-25 ENCOUNTER — Ambulatory Visit: Payer: Medicare PPO | Admitting: Dermatology

## 2022-08-25 VITALS — BP 144/79 | HR 71

## 2022-08-25 DIAGNOSIS — L300 Nummular dermatitis: Secondary | ICD-10-CM | POA: Diagnosis not present

## 2022-08-25 DIAGNOSIS — L0100 Impetigo, unspecified: Secondary | ICD-10-CM

## 2022-08-25 MED ORDER — CLOBETASOL PROPIONATE 0.05 % EX CREA: TOPICAL_CREAM | CUTANEOUS | 0 refills | Status: DC

## 2022-08-25 MED ORDER — MUPIROCIN 2 % EX OINT: TOPICAL_OINTMENT | CUTANEOUS | 0 refills | Status: DC

## 2022-08-25 NOTE — Progress Notes (Signed)
   Follow-Up Visit   Subjective  Victor Smith is a 82 y.o. male who presents for the following: red patch on the left calf x 6 weeks, not itchy, has tried rubbing alcohol, OTC antifungal.   The patient has spots, moles and lesions to be evaluated, some may be new or changing.     The following portions of the chart were reviewed this encounter and updated as appropriate: medications, allergies, medical history  Review of Systems:  No other skin or systemic complaints except as noted in HPI or Assessment and Plan.  Objective  Well appearing patient in no apparent distress; mood and affect are within normal limits.  A focused examination was performed of the following areas: Face, legs  Relevant physical exam findings are noted in the Assessment and Plan.  left calf, right calf Nummular pink scaly patches bil calves, with yellow crusting of the left calf.    Assessment & Plan   Nummular dermatitis left calf, right calf  With secondary impetiginization.   Avoid scratching.   Recommend mild soap and moisturizing cream 1-2 times daily.  Gentle skin care handout provided.   Start clobetasol cream Apply to AA rash BID up to 4 weeks. Apply first. Avoid face, groin, axilla. Dsp 45g 0Rf. Topical steroids (such as triamcinolone, fluocinolone, fluocinonide, mometasone, clobetasol, halobetasol, betamethasone, hydrocortisone) can cause thinning and lightening of the skin if they are used for too long in the same area. Your physician has selected the right strength medicine for your problem and area affected on the body. Please use your medication only as directed by your physician to prevent side effects.   Start mupirocin 2% ointment Apply to AA rash on legs BID after applying clobetasol x 1 week dsp 22g 0Rf.   clobetasol cream (TEMOVATE) 0.05 % - left calf, right calf Apply to affected areas rash on legs twice daily until improved. Apply first.  mupirocin ointment (BACTROBAN) 2  % - left calf, right calf Apply to affected areas rash on legs twice daily x 1 week or until improved. Apply second.     Return as scheduled.  ICherlyn Labella, CMA, am acting as scribe for Willeen Niece, MD .   Documentation: I have reviewed the above documentation for accuracy and completeness, and I agree with the above.  Willeen Niece, MD

## 2022-08-25 NOTE — Patient Instructions (Addendum)
Apply 1st - Start clobetasol cream apply 1st to rash on legs twice daily until rash improved, use up to 4 weeks. Avoid face, groin, underarms. May spot treat itchy areas on body.   Apply 2nd - Start mupirocin 2% ointment - Apply to rash on legs twice daily x 1 week.    Gentle Skin Care Guide  1. Bathe no more than once a day.  2. Avoid bathing in hot water  3. Use a mild soap like Dove, Vanicream, Cetaphil, CeraVe. Can use Lever 2000 or Cetaphil antibacterial soap  4. Use soap only where you need it. On most days, use it under your arms, between your legs, and on your feet. Let the water rinse other areas unless visibly dirty.  5. When you get out of the bath/shower, use a towel to gently blot your skin dry, don't rub it.  6. While your skin is still a little damp, apply a moisturizing cream such as Vanicream, CeraVe, Cetaphil, Eucerin, Sarna lotion or plain Vaseline Jelly. For hands apply Neutrogena [place] Hand Cream or Excipial Hand Cream.  7. Reapply moisturizer any time you start to itch or feel dry.  8. Sometimes using free and clear laundry detergents can be helpful. Fabric softener sheets should be avoided. Downy Free & Gentle liquid, or any liquid fabric softener that is free of dyes and perfumes, it acceptable to use  9. If your doctor has given you prescription creams you may apply moisturizers over them     Due to recent changes in healthcare laws, you may see results of your pathology and/or laboratory studies on MyChart before the doctors have had a chance to review them. We understand that in some cases there may be results that are confusing or concerning to you. Please understand that not all results are received at the same time and often the doctors may need to interpret multiple results in order to provide you with the best plan of care or course of treatment. Therefore, we ask that you please give [place] 2 business days to thoroughly review all your results before  contacting the office for clarification. Should we see a critical lab result, you will be contacted sooner.   If You Need Anything After Your Visit  If you have any questions or concerns for your doctor, please call our main line at [phone] and press option 4 to reach your doctor's medical assistant. If no one answers, please leave a voicemail as directed and we will return your call as soon as possible. Messages left after 4 pm will be answered the following business day.   You may also send [place] a message via MyChart. We typically respond to MyChart messages within 1-2 business days.  For prescription refills, please ask your pharmacy to contact our office. Our fax number is [phone].  If you have an urgent issue when the clinic is closed that cannot wait until the next business day, you can page your doctor at the number below.    Please note that while we do our best to be available for urgent issues outside of office hours, we are not available 24/7.   If you have an urgent issue and are unable to reach [place], you may choose to seek medical care at your doctor's office, retail clinic, urgent care center, or emergency room.  If you have a medical emergency, please immediately call 911 or go to the emergency department.  Pager Numbers  - Dr. Gwen Pounds: [phone]  - Dr.  Moye: [phone]  - Dr. Roseanne Reno: [phone]  In the event of inclement weather, please call our main line at [phone] for an update on the status of any delays or closures.  Dermatology Medication Tips: Please keep the boxes that topical medications come in in order to help keep track of the instructions about where and how to use these. Pharmacies typically print the medication instructions only on the boxes and not directly on the medication tubes.   If your medication is too expensive, please contact our office at [phone] option 4 or send [place] a message through MyChart.   We are unable to tell  what your co-pay for medications will be in advance as this is different depending on your insurance coverage. However, we may be able to find a substitute medication at lower cost or fill out paperwork to get insurance to cover a needed medication.   If a prior authorization is required to get your medication covered by your insurance company, please allow [place] 1-2 business days to complete this process.  Drug prices often vary depending on where the prescription is filled and some pharmacies may offer cheaper prices.  The website www.goodrx.com contains coupons for medications through different pharmacies. The prices here do not account for what the cost may be with help from insurance (it may be cheaper with your insurance), but the website can give you the price if you did not use any insurance.  - You can print the associated coupon and take it with your prescription to the pharmacy.  - You may also stop by our office during regular business hours and pick up a GoodRx coupon card.  - If you need your prescription sent electronically to a different pharmacy, notify our office through [facility] or by phone at [phone] option 4.     Si Usted Necesita Algo Despus de Su Visita  Tambin puede enviarnos un mensaje a travs de Clinical cytogeneticist. Por lo general respondemos a los mensajes de MyChart en el transcurso de 1 a 2 das hbiles.  Para renovar recetas, por favor pida a su farmacia que se ponga en contacto con nuestra oficina. Annie Sable de fax es [place] [phone].  Si tiene un asunto urgente cuando la clnica est cerrada y que no puede esperar hasta el siguiente da hbil, puede llamar/localizar a su doctor(a) al nmero que aparece a continuacin.   Por favor, tenga en cuenta que aunque hacemos todo lo posible para estar disponibles para asuntos urgentes fuera del horario de [place], no estamos disponibles las 24 horas del da, los 7 [address]  [address] de la [place].   Si tiene un problema  urgente y no puede comunicarse con nosotros, puede optar por buscar atencin mdica  en el consultorio de su doctor(a), en una clnica privada, en un centro de atencin urgente o en una sala de emergencias.  Si tiene Engineer, drilling, por favor llame inmediatamente al 911 o vaya a la sala de emergencias.  Nmeros de bper  - Dr. Gwen Pounds: [phone]  - Dra. Moye: [phone]  - Dra. Roseanne Reno: [phone]  En caso de inclemencias del [place], por favor llame a Lacy Duverney principal al [phone] para una actualizacin sobre el [place] de cualquier retraso o cierre.  Consejos para la medicacin en dermatologa: Por favor, guarde las cajas en las que vienen los medicamentos de uso tpico para ayudarle a seguir las instrucciones sobre dnde y cmo usarlos. Las farmacias generalmente imprimen las instrucciones del medicamento slo en las cajas y no  directamente en los tubos del medicamento.   Si su medicamento es muy caro, por favor, pngase en contacto con Rolm Gala llamando al [phone] y presione la opcin 4 o envenos un mensaje a travs de Clinical cytogeneticist.   No podemos decirle cul ser su copago por los medicamentos por adelantado ya que esto es diferente dependiendo de la cobertura de su seguro. Sin embargo, es posible que podamos encontrar un medicamento sustituto a Audiological scientist un formulario para que el seguro cubra el medicamento que se considera necesario.   Si se requiere una autorizacin previa para que su compaa de seguros [place] su medicamento, por favor permtanos de 1 a 2 das hbiles para completar [address]  Los precios de los medicamentos varan con frecuencia dependiendo del Environmental consultant de dnde se surte la receta y alguna farmacias pueden ofrecer precios ms baratos.  El sitio web www.goodrx.com tiene cupones para medicamentos de Health and safety inspector. Los precios aqu no tienen en cuenta lo que podra costar con la ayuda del seguro (puede ser ms barato con  su seguro), pero el sitio web puede darle el precio si no utiliz Tourist information centre manager.  - Puede imprimir el cupn correspondiente y llevarlo con su receta a la farmacia.  - Tambin puede pasar por nuestra oficina durante el horario de atencin regular y Education officer, museum una tarjeta de cupones de GoodRx.  - Si necesita que su receta se enve electrnicamente a una farmacia diferente, informe a nuestra oficina a travs de MyChart de [place] o por telfono llamando al [phone] y presione la opcin 4.

## 2022-08-26 ENCOUNTER — Other Ambulatory Visit: Payer: Self-pay

## 2022-08-26 DIAGNOSIS — L300 Nummular dermatitis: Secondary | ICD-10-CM

## 2022-08-26 MED ORDER — CLOBETASOL PROPIONATE 0.05 % EX CREA: TOPICAL_CREAM | CUTANEOUS | 0 refills | Status: DC

## 2022-08-26 NOTE — Progress Notes (Signed)
Patient's wife states [facility] did not have Clobetasol Cream that was sent in yesterday. RX resent this morning. Spouse advised to call if she has any further issues. aw

## 2022-11-09 ENCOUNTER — Ambulatory Visit: Payer: Medicare PPO | Admitting: Dermatology

## 2023-09-30 ENCOUNTER — Encounter: Payer: Self-pay | Admitting: Family Medicine

## 2023-09-30 ENCOUNTER — Ambulatory Visit: Admitting: Family Medicine

## 2023-09-30 VITALS — BP 128/60 | HR 85 | Ht >= 80 in | Wt 231.4 lb

## 2023-09-30 DIAGNOSIS — H9190 Unspecified hearing loss, unspecified ear: Secondary | ICD-10-CM | POA: Diagnosis not present

## 2023-09-30 DIAGNOSIS — E785 Hyperlipidemia, unspecified: Secondary | ICD-10-CM | POA: Diagnosis not present

## 2023-09-30 DIAGNOSIS — R5383 Other fatigue: Secondary | ICD-10-CM | POA: Diagnosis not present

## 2023-09-30 DIAGNOSIS — M19041 Primary osteoarthritis, right hand: Secondary | ICD-10-CM | POA: Diagnosis not present

## 2023-09-30 DIAGNOSIS — M19042 Primary osteoarthritis, left hand: Secondary | ICD-10-CM

## 2023-09-30 NOTE — Progress Notes (Unsigned)
 New Patient Office Visit  Subjective    Patient ID: Victor Smith, male    DOB: 08-05-1940  Age: 83 y.o. MRN: [id number]  CC:  Chief Complaint  Patient presents with   Establish Care    Patient presents today to establish care. He is doing well and he would like to discuss fatigue out of nowhere.      Assessment & Plan:   Other fatigue  Hearing loss, unspecified hearing loss type, unspecified laterality  Hyperlipidemia, unspecified hyperlipidemia type  Osteoarthritis of both hands, unspecified osteoarthritis type  Fatigue: Recommend healthy lifestyle changes, hydration, follow-up if symptoms do not improve. Left submandibular discomfort, exam normal, likely secondary to lymphadenopathy versus sialolith, recommend hydration and Tylenol  for pain as needed Recommend monitoring for now and return to clinic if symptoms worsen or do not improve Return in about 9 months (around 06/29/2024).   [name], MD   83 year old male with past medical history of hyperlipidemia, difficulty hearing, arthritis presents to the clinic due to ongoing fatigue, left rash which has resolved, left submandibular discomfort.  Patient was seen for the same issue by his previous provider and was diagnosed with out of body experience send CT imaging was ordered to rule out brain cancer.  Patient is seeing me to get a second opinion.  Patient reports that he is in excellent shape but for the last 3 weeks he experienced above symptoms within the span of 3 weeks.  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  Patient reports 3 weeks ago he came in contact with rosebushes which caused a small cut on his left arm which healed overall well with some redness due to new scar tissue, later he noticed left submandibular's discomfort he thinks there is a mass underneath, denies any pain he also felt his left hand arthritis worsened but improved as of today.      [name] presents to establish care   Outpatient Encounter Medications as of 09/30/2023  Medication Sig   Ascorbic Acid (VITAMIN C) 1000 MG tablet Take 5,000 mg by mouth daily.   aspirin 81 MG tablet Take 81 mg by mouth every other day.   Multiple Vitamin (MULTIVITAMIN) tablet Take 1 tablet by mouth daily.   niacin 500 MG tablet Take 2,000 mg by mouth at bedtime.   [DISCONTINUED] clobetasol  cream (TEMOVATE ) 0.05 % Apply to affected areas rash on legs twice daily until improved. Apply first.   [DISCONTINUED] fish oil-omega-3 fatty acids 1000 MG capsule Take 5 g by mouth daily with lunch.    [DISCONTINUED] mupirocin  ointment (BACTROBAN ) 2 % Apply to affected areas rash on legs twice daily x 1 week or until improved. Apply second.   No facility-administered encounter medications on file as of 09/30/2023.      Review of Systems  All other systems reviewed and are negative.       Objective    BP 128/60   Pulse 85   Ht 6' 8 (2.032 m)   Wt 231 lb 6.4 oz (105 kg)   SpO2 96%   BMI 25.42 kg/m   Physical Exam Vitals and nursing note reviewed.  Constitutional:      Appearance: Normal appearance.  HENT:     Head: Normocephalic.     Right Ear: External ear normal.     Left Ear: External ear normal.   Eyes:     Conjunctiva/sclera: Conjunctivae normal.    Cardiovascular:  Rate and Rhythm: Normal rate.  Pulmonary:     Effort: Pulmonary effort is normal. No respiratory distress.  Abdominal:     Palpations: Abdomen is soft.   Musculoskeletal:        General: Normal range of motion.   Skin:    General: Skin is warm.   Neurological:     Mental Status: He is alert and oriented to person, place, and time.   Psychiatric:        Mood and Affect: Mood normal.

## 2024-07-05 ENCOUNTER — Encounter: Admitting: Family Medicine
